# Patient Record
Sex: Female | Born: 1985 | Race: White | Hispanic: No | Marital: Single | Smoking: Never smoker
Health system: Southern US, Community
[De-identification: ages and names within clinical notes are randomized; demographics above are authoritative.]

## PROBLEM LIST (undated history)

## (undated) ENCOUNTER — Inpatient Hospital Stay (HOSPITAL_COMMUNITY): Payer: Self-pay

## (undated) DIAGNOSIS — R87629 Unspecified abnormal cytological findings in specimens from vagina: Secondary | ICD-10-CM

## (undated) DIAGNOSIS — T7840XA Allergy, unspecified, initial encounter: Secondary | ICD-10-CM

## (undated) DIAGNOSIS — Z8619 Personal history of other infectious and parasitic diseases: Secondary | ICD-10-CM

## (undated) HISTORY — PX: KNEE SURGERY: SHX244

## (undated) HISTORY — PX: EYE SURGERY: SHX253

## (undated) HISTORY — DX: Allergy, unspecified, initial encounter: T78.40XA

## (undated) HISTORY — DX: Personal history of other infectious and parasitic diseases: Z86.19

## (undated) HISTORY — PX: LEEP: SHX91

## (undated) HISTORY — PX: BELPHAROPTOSIS REPAIR: SHX369

## (undated) HISTORY — PX: TONSILLECTOMY: SUR1361

## (undated) HISTORY — DX: Unspecified abnormal cytological findings in specimens from vagina: R87.629

---
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
## 2006-01-14 ENCOUNTER — Emergency Department (HOSPITAL_COMMUNITY): Admission: EM | Admit: 2006-01-14 | Discharge: 2006-01-14 | Payer: Self-pay | Admitting: Emergency Medicine

## 2007-08-28 ENCOUNTER — Encounter: Admission: RE | Admit: 2007-08-28 | Discharge: 2007-08-28 | Payer: Self-pay | Admitting: Family Medicine

## 2008-04-02 ENCOUNTER — Emergency Department (HOSPITAL_COMMUNITY): Admission: EM | Admit: 2008-04-02 | Discharge: 2008-04-02 | Payer: Self-pay | Admitting: Emergency Medicine

## 2008-06-23 ENCOUNTER — Ambulatory Visit (HOSPITAL_COMMUNITY): Admission: RE | Admit: 2008-06-23 | Discharge: 2008-06-23 | Payer: Self-pay | Admitting: Obstetrics and Gynecology

## 2009-08-16 DIAGNOSIS — F419 Anxiety disorder, unspecified: Secondary | ICD-10-CM

## 2009-08-16 DIAGNOSIS — F32A Depression, unspecified: Secondary | ICD-10-CM | POA: Insufficient documentation

## 2009-08-16 HISTORY — DX: Anxiety disorder, unspecified: F41.9

## 2010-08-02 LAB — TYPE AND SCREEN
ABO/RH(D): A NEG
Antibody Screen: NEGATIVE

## 2010-08-02 LAB — CBC
MCHC: 34.2 g/dL (ref 30.0–36.0)
RDW: 12.4 % (ref 11.5–15.5)

## 2010-09-04 NOTE — Op Note (Signed)
Betty Gentry, NATHANIEL NO.:  [id number]   MEDICAL RECORD NO.:  [id number]          PATIENT TYPE:  AMB   LOCATION:  SDC                           FACILITY:  [facility]   PHYSICIAN:  Zelphia Cairo, MD    DATE OF BIRTH:  04/13/1986   DATE OF PROCEDURE:  DATE OF DISCHARGE:                               OPERATIVE REPORT   PREOPERATIVE DIAGNOSIS:  Chronic pelvic pain.   POSTOPERATIVE DIAGNOSES:  1. Chronic pelvic pain.  2. Left paratubal cyst.   PROCEDURE:  Diagnostic laparoscopy with I&D of left paratubal cyst.   SURGEON:  Zelphia Cairo, MD   ANESTHESIA:  General.   FINDINGS:  Left paratubal cyst.  Normal appendix.  Normal ovaries.  No  evidence of endometriosis.   COMPLICATIONS:  None.   ESTIMATED BLOOD LOSS:  Minimal.   ANESTHESIA:  General.   CONDITION:  Stable to recovery room.   PROCEDURE IN DETAIL:  Mahlia was taken to the operating room where  general anesthesia was obtained.  She was placed in the dorsal lithotomy  position using Allen stirrups.  She was prepped and draped in sterile  fashion, and a catheter was used to drain her bladder for 20 mL of clear  urine.  Speculum was placed in the vagina and a single-tooth tenaculum  placed on the anterior lip of the cervix.  Hulka clamp was placed on the  cervix to provide uterine manipulation.  Single-tooth tenaculum and  speculum were then removed.  Our attention was turned to the abdomen.   A 0.25% Marcaine was used to provide local anesthesia in the  infraumbilical region.  Infraumbilical incision was then made with a  scalpel and dissected bluntly to the level of the fascia using a Kelly  clamp.  The Veress needle was then inserted and 3 liters of CO2 were  used to insufflate the abdomen and pelvis.  Veress needle was then  removed and an optical trocar was inserted.  A survey of the abdomen and  pelvis was then performed, normal-appearing right upper quadrant and  appendix were visualized.   Bilateral ovaries appeared normal.  Right  fallopian tube appeared normal.  There was a small 5-7 mm paratubal cyst  noted on the left fallopian tube.  Uterus appeared normal.  Bilateral  ovarian fossa, uterosacral ligaments were found to be normal.  No  evidence of endometriosis was noted.  A suprapubic incision was then  made with a scalpel and a 5-mm trocar was inserted under direct  visualization.  Paratubal cyst was grasped with a blunt grasper and  scissors were used to incise the left paratubal cyst.  This drained for  clear fluid.  The pelvis was then irrigated and found to be hemostatic.  No other abnormalities were noted.  All  trocars and instruments were then removed from the abdomen.  The fascia  was reapproximated using Vicryl.  The skin was closed using 3-0 Vicryl  and Dermabond.  Hulka clamp was removed.  The patient was taken to the  recovery room in stable condition.      Zelphia Cairo, MD  Electronically Signed  GA/MEDQ  D:  06/23/2008  T:  06/23/2008  Job:  [id number]

## 2011-01-25 LAB — POCT PREGNANCY, URINE: Preg Test, Ur: NEGATIVE

## 2015-06-27 LAB — HM PAP SMEAR

## 2016-07-24 LAB — HM PAP SMEAR

## 2016-08-02 LAB — OB RESULTS CONSOLE ABO/RH: RH TYPE: NEGATIVE

## 2016-08-02 LAB — OB RESULTS CONSOLE ANTIBODY SCREEN: Antibody Screen: NEGATIVE

## 2016-08-02 LAB — OB RESULTS CONSOLE HIV ANTIBODY (ROUTINE TESTING): HIV: NONREACTIVE

## 2016-08-02 LAB — OB RESULTS CONSOLE GC/CHLAMYDIA
CHLAMYDIA, DNA PROBE: NEGATIVE
Gonorrhea: NEGATIVE

## 2016-08-02 LAB — OB RESULTS CONSOLE RPR: RPR: NONREACTIVE

## 2016-08-02 LAB — OB RESULTS CONSOLE RUBELLA ANTIBODY, IGM: RUBELLA: IMMUNE

## 2017-03-04 LAB — OB RESULTS CONSOLE GBS: GBS: NEGATIVE

## 2017-03-12 ENCOUNTER — Telehealth (HOSPITAL_COMMUNITY): Payer: Self-pay | Admitting: *Deleted

## 2017-03-12 ENCOUNTER — Encounter (HOSPITAL_COMMUNITY): Payer: Self-pay | Admitting: *Deleted

## 2017-03-12 NOTE — Telephone Encounter (Signed)
Preadmission screen  

## 2017-03-17 ENCOUNTER — Inpatient Hospital Stay (HOSPITAL_COMMUNITY)
Admission: AD | Admit: 2017-03-17 | Discharge: 2017-03-20 | DRG: 807 | Disposition: A | Discharge status: Home / Self Care | Payer: BLUE CROSS/BLUE SHIELD | Source: Ambulatory Visit | Attending: Obstetrics and Gynecology | Admitting: Obstetrics and Gynecology

## 2017-03-17 ENCOUNTER — Encounter (HOSPITAL_COMMUNITY): Payer: Self-pay | Admitting: Anesthesiology

## 2017-03-17 ENCOUNTER — Encounter (HOSPITAL_COMMUNITY): Payer: Self-pay

## 2017-03-17 DIAGNOSIS — Z3483 Encounter for supervision of other normal pregnancy, third trimester: Secondary | ICD-10-CM | POA: Diagnosis present

## 2017-03-17 DIAGNOSIS — Z3A40 40 weeks gestation of pregnancy: Secondary | ICD-10-CM

## 2017-03-17 DIAGNOSIS — O479 False labor, unspecified: Secondary | ICD-10-CM | POA: Diagnosis present

## 2017-03-17 HISTORY — DX: False labor, unspecified: O47.9

## 2017-03-17 LAB — CBC
HEMATOCRIT: 41.4 % (ref 36.0–46.0)
HEMOGLOBIN: 14 g/dL (ref 12.0–15.0)
MCH: 30.9 pg (ref 26.0–34.0)
MCHC: 33.8 g/dL (ref 30.0–36.0)
MCV: 91.4 fL (ref 78.0–100.0)
Platelets: 211 10*3/uL (ref 150–400)
RBC: 4.53 MIL/uL (ref 3.87–5.11)
RDW: 13.8 % (ref 11.5–15.5)
WBC: 13.8 10*3/uL — AB (ref 4.0–10.5)

## 2017-03-17 MED ORDER — EPHEDRINE 5 MG/ML INJ
10.0000 mg | INTRAVENOUS | Status: DC | PRN
  Filled 2017-03-17: qty 2

## 2017-03-17 MED ORDER — ONDANSETRON HCL 4 MG/2ML IJ SOLN
4.0000 mg | Freq: Four times a day (QID) | INTRAMUSCULAR | Status: DC | PRN
  Administered 2017-03-18: 4 mg via INTRAVENOUS
  Filled 2017-03-17: qty 2

## 2017-03-17 MED ORDER — DIPHENHYDRAMINE HCL 50 MG/ML IJ SOLN
12.5000 mg | INTRAMUSCULAR | Status: DC | PRN
  Filled 2017-03-17 (×2): qty 1

## 2017-03-17 MED ORDER — FENTANYL 2.5 MCG/ML BUPIVACAINE 1/10 % EPIDURAL INFUSION (WH - ANES)
14.0000 mL/h | INTRAMUSCULAR | Status: DC | PRN
  Administered 2017-03-18: 14 mL/h via EPIDURAL
  Filled 2017-03-17: qty 100

## 2017-03-17 MED ORDER — PHENYLEPHRINE 40 MCG/ML (10ML) SYRINGE FOR IV PUSH (FOR BLOOD PRESSURE SUPPORT)
80.0000 ug | PREFILLED_SYRINGE | INTRAVENOUS | Status: DC | PRN
  Filled 2017-03-17: qty 5
  Filled 2017-03-17: qty 10

## 2017-03-17 MED ORDER — ACETAMINOPHEN 325 MG PO TABS
650.0000 mg | ORAL_TABLET | ORAL | Status: DC | PRN
Start: 2017-03-17 — End: 2017-03-18

## 2017-03-17 MED ORDER — OXYCODONE-ACETAMINOPHEN 5-325 MG PO TABS
2.0000 | ORAL_TABLET | ORAL | Status: DC | PRN
Start: 2017-03-17 — End: 2017-03-18

## 2017-03-17 MED ORDER — OXYCODONE-ACETAMINOPHEN 5-325 MG PO TABS: 1.0000 | ORAL_TABLET | ORAL | Status: DC | PRN

## 2017-03-17 MED ORDER — LACTATED RINGERS IV SOLN
INTRAVENOUS | Status: DC
  Administered 2017-03-17: 21:00:00 via INTRAVENOUS

## 2017-03-17 MED ORDER — LACTATED RINGERS IV SOLN
500.0000 mL | Freq: Once | INTRAVENOUS | Status: AC
  Administered 2017-03-17: 500 mL via INTRAVENOUS

## 2017-03-17 MED ORDER — BUTORPHANOL TARTRATE 1 MG/ML IJ SOLN
1.0000 mg | INTRAMUSCULAR | Status: DC | PRN
  Administered 2017-03-17: 1 mg via INTRAVENOUS
  Filled 2017-03-17 (×2): qty 1

## 2017-03-17 MED ORDER — FLEET ENEMA 7-19 GM/118ML RE ENEM: 1.0000 | ENEMA | RECTAL | Status: DC | PRN

## 2017-03-17 MED ORDER — SOD CITRATE-CITRIC ACID 500-334 MG/5ML PO SOLN: 30.0000 mL | ORAL | Status: DC | PRN

## 2017-03-17 MED ORDER — OXYTOCIN 40 UNITS IN LACTATED RINGERS INFUSION - SIMPLE MED
2.5000 [IU]/h | INTRAVENOUS | Status: DC
Start: 2017-03-17 — End: 2017-03-18
  Filled 2017-03-17: qty 1000

## 2017-03-17 MED ORDER — PHENYLEPHRINE 40 MCG/ML (10ML) SYRINGE FOR IV PUSH (FOR BLOOD PRESSURE SUPPORT)
80.0000 ug | PREFILLED_SYRINGE | INTRAVENOUS | Status: DC | PRN
  Filled 2017-03-17: qty 5

## 2017-03-17 MED ORDER — LACTATED RINGERS IV SOLN: 500.0000 mL | INTRAVENOUS | Status: DC | PRN

## 2017-03-17 MED ORDER — OXYTOCIN BOLUS FROM INFUSION: 500.0000 mL | Freq: Once | INTRAVENOUS | Status: DC

## 2017-03-17 MED ORDER — LIDOCAINE HCL (PF) 1 % IJ SOLN
30.0000 mL | INTRAMUSCULAR | Status: DC | PRN
  Filled 2017-03-17: qty 30

## 2017-03-17 NOTE — H&P (Signed)
Betty Gentry is a 31 y.o. female presenting for UCs. No ROM. OB History    Gravida Para Term Preterm AB Living   1             SAB TAB Ectopic Multiple Live Births                 Past Medical History:  Diagnosis Date  . Vaginal Pap smear, abnormal    Past Surgical History:  Procedure Laterality Date  . BELPHAROPTOSIS REPAIR    . KNEE SURGERY    . LEEP     Family History: family history includes Heart attack in her paternal grandmother; Hypertension in her father; Lung cancer in her paternal grandmother. Social History:  reports that  has never smoked. she has never used smokeless tobacco. She reports that she does not drink alcohol or use drugs.     Maternal Diabetes: No Genetic Screening: Normal Maternal Ultrasounds/Referrals: Normal Fetal Ultrasounds or other Referrals:  None Maternal Substance Abuse:  No Significant Maternal Medications:  None Significant Maternal Lab Results:  None Other Comments:  None  Review of Systems  Eyes: Negative for blurred vision.  Gastrointestinal: Negative for abdominal pain.  Neurological: Negative for headaches.   Maternal Medical History:  Reason for admission: Contractions.   Contractions: Onset was 3-5 hours ago.    Fetal activity: Perceived fetal activity is normal.      Dilation: 4 Effacement (%): 90 Station: -1 Exam by:: dr Jasiah Buntin Blood pressure 102/69, pulse 64, temperature 98.6 F (37 C), temperature source Oral, resp. rate 18, height 5\' 2"  (1.575 m), weight 143 lb (64.9 kg), last menstrual period 06/05/2016, SpO2 100 %. Maternal Exam:  Uterine Assessment: Contraction strength is firm.  Contraction frequency is regular.   Abdomen: Patient reports no abdominal tenderness. Fetal presentation: vertex     Fetal Exam Fetal State Assessment: Category I - tracings are normal.     Physical Exam  Cardiovascular: Normal rate and regular rhythm.  Respiratory: Effort normal and breath sounds normal.  GI:  Soft. There is no tenderness.  Neurological: She has normal reflexes.    Cx 4/90/-1/vtx AROM clear  Prenatal labs: ABO, Rh: A/Negative/-- (04/13 0000) Antibody: Negative (04/13 0000) Rubella: Immune (04/13 0000) RPR: Nonreactive (04/13 0000)  HBsAg:    HIV: Non-reactive (04/13 0000)  GBS: Negative (11/13 0000)   Assessment/Plan: 31 yo G1P0 @ 40 5/7 weeks in active labor Anticipate vaginal delivery Epidural prn   Roselle LocusJames E Thressa Shiffer II 03/17/2017, 9:22 PM

## 2017-03-17 NOTE — Anesthesia Pain Management Evaluation Note (Signed)
  CRNA Pain Management Visit Note  Patient: Betty Gentry, 31 y.o., female  "Hello I am a member of the anesthesia team at [facility]. We have an anesthesia team available at all times to provide care throughout the hospital, including epidural management and anesthesia for C-section. I don't know your plan for the delivery whether it a natural birth, water birth, IV sedation, nitrous supplementation, doula or epidural, but we want to meet your pain goals."   1.Was your pain managed to your expectations on prior hospitalizations?   No prior hospitalizations  2.What is your expectation for pain management during this hospitalization?     Epidural  3.How can we help you reach that goal? unsure  Record the patient's initial score and the patient's pain goal.   Pain: 8  Pain Goal: 9 [facility] wants you to be able to say your pain was always managed very well.  Cephus ShellingBURGER,Tykeem Lanzer 03/17/2017

## 2017-03-17 NOTE — Progress Notes (Signed)
Ready for epidural FHT cat one UCs q2-3 min Cx 4/C/-2

## 2017-03-17 NOTE — MAU Note (Signed)
Patient states a lot of pain and period like cramps. Patient says no bleeding or fluid coming out.

## 2017-03-18 ENCOUNTER — Inpatient Hospital Stay (HOSPITAL_COMMUNITY): Payer: BLUE CROSS/BLUE SHIELD | Admitting: Anesthesiology

## 2017-03-18 ENCOUNTER — Encounter (HOSPITAL_COMMUNITY): Payer: Self-pay

## 2017-03-18 ENCOUNTER — Other Ambulatory Visit: Payer: Self-pay

## 2017-03-18 LAB — RPR: RPR Ser Ql: NONREACTIVE

## 2017-03-18 MED ORDER — SCOPOLAMINE 1 MG/3DAYS TD PT72
1.0000 | MEDICATED_PATCH | Freq: Once | TRANSDERMAL | Status: DC
  Filled 2017-03-18: qty 1

## 2017-03-18 MED ORDER — DIPHENHYDRAMINE HCL 50 MG/ML IJ SOLN: 12.5000 mg | INTRAMUSCULAR | Status: DC | PRN

## 2017-03-18 MED ORDER — COCONUT OIL OIL
1.0000 "application " | TOPICAL_OIL | Status: DC | PRN
  Administered 2017-03-18: 1 via TOPICAL
  Filled 2017-03-18: qty 120

## 2017-03-18 MED ORDER — NALOXONE HCL 0.4 MG/ML IJ SOLN: 0.4000 mg | INTRAMUSCULAR | Status: DC | PRN

## 2017-03-18 MED ORDER — PHENYLEPHRINE 40 MCG/ML (10ML) SYRINGE FOR IV PUSH (FOR BLOOD PRESSURE SUPPORT)
80.0000 ug | PREFILLED_SYRINGE | INTRAVENOUS | Status: DC | PRN
  Filled 2017-03-18: qty 5

## 2017-03-18 MED ORDER — OXYCODONE HCL 5 MG PO TABS: 10.0000 mg | ORAL_TABLET | ORAL | Status: DC | PRN

## 2017-03-18 MED ORDER — NALBUPHINE HCL 10 MG/ML IJ SOLN
5.0000 mg | INTRAMUSCULAR | Status: DC | PRN
Start: 2017-03-18 — End: 2017-03-20

## 2017-03-18 MED ORDER — ONDANSETRON HCL 4 MG/2ML IJ SOLN: 4.0000 mg | Freq: Three times a day (TID) | INTRAMUSCULAR | Status: DC | PRN

## 2017-03-18 MED ORDER — KETOROLAC TROMETHAMINE 30 MG/ML IJ SOLN: 30.0000 mg | Freq: Four times a day (QID) | INTRAMUSCULAR | Status: AC | PRN

## 2017-03-18 MED ORDER — NALBUPHINE HCL 10 MG/ML IJ SOLN: 5.0000 mg | Freq: Once | INTRAMUSCULAR | Status: DC | PRN

## 2017-03-18 MED ORDER — PRENATAL MULTIVITAMIN CH
1.0000 | ORAL_TABLET | Freq: Every day | ORAL | Status: DC
  Administered 2017-03-18 – 2017-03-19 (×2): 1 via ORAL
  Filled 2017-03-18 (×2): qty 1

## 2017-03-18 MED ORDER — EPHEDRINE 5 MG/ML INJ
10.0000 mg | INTRAVENOUS | Status: DC | PRN
  Filled 2017-03-18: qty 2

## 2017-03-18 MED ORDER — SODIUM CHLORIDE 0.9% FLUSH: 3.0000 mL | INTRAVENOUS | Status: DC | PRN

## 2017-03-18 MED ORDER — LIDOCAINE HCL (PF) 1 % IJ SOLN
INTRAMUSCULAR | Status: DC | PRN
  Administered 2017-03-18 (×2): 5 mL via EPIDURAL

## 2017-03-18 MED ORDER — ONDANSETRON HCL 4 MG/2ML IJ SOLN: 4.0000 mg | INTRAMUSCULAR | Status: DC | PRN

## 2017-03-18 MED ORDER — OXYCODONE HCL 5 MG PO TABS: 5.0000 mg | ORAL_TABLET | ORAL | Status: DC | PRN

## 2017-03-18 MED ORDER — DIBUCAINE 1 % RE OINT: 1.0000 "application " | TOPICAL_OINTMENT | RECTAL | Status: DC | PRN

## 2017-03-18 MED ORDER — SIMETHICONE 80 MG PO CHEW
80.0000 mg | CHEWABLE_TABLET | ORAL | Status: DC | PRN
Start: 2017-03-18 — End: 2017-03-20

## 2017-03-18 MED ORDER — DIPHENHYDRAMINE HCL 50 MG/ML IJ SOLN
12.5000 mg | INTRAMUSCULAR | Status: DC | PRN
  Administered 2017-03-18 (×2): 12.5 mg via INTRAVENOUS

## 2017-03-18 MED ORDER — ZOLPIDEM TARTRATE 5 MG PO TABS: 5.0000 mg | ORAL_TABLET | Freq: Every evening | ORAL | Status: DC | PRN

## 2017-03-18 MED ORDER — ONDANSETRON HCL 4 MG PO TABS
4.0000 mg | ORAL_TABLET | ORAL | Status: DC | PRN
Start: 2017-03-18 — End: 2017-03-20

## 2017-03-18 MED ORDER — DIPHENHYDRAMINE HCL 25 MG PO CAPS: 25.0000 mg | ORAL_CAPSULE | Freq: Four times a day (QID) | ORAL | Status: DC | PRN

## 2017-03-18 MED ORDER — OXYTOCIN 40 UNITS IN LACTATED RINGERS INFUSION - SIMPLE MED
1.0000 m[IU]/min | INTRAVENOUS | Status: DC
  Administered 2017-03-18: 2 m[IU]/min via INTRAVENOUS

## 2017-03-18 MED ORDER — NALBUPHINE HCL 10 MG/ML IJ SOLN: 5.0000 mg | INTRAMUSCULAR | Status: DC | PRN

## 2017-03-18 MED ORDER — LACTATED RINGERS IV SOLN: 500.0000 mL | Freq: Once | INTRAVENOUS | Status: AC

## 2017-03-18 MED ORDER — MEPERIDINE HCL 25 MG/ML IJ SOLN: 6.2500 mg | INTRAMUSCULAR | Status: DC | PRN

## 2017-03-18 MED ORDER — TETANUS-DIPHTH-ACELL PERTUSSIS 5-2.5-18.5 LF-MCG/0.5 IM SUSP: 0.5000 mL | Freq: Once | INTRAMUSCULAR | Status: DC

## 2017-03-18 MED ORDER — WITCH HAZEL-GLYCERIN EX PADS
1.0000 "application " | MEDICATED_PAD | CUTANEOUS | Status: DC | PRN
  Administered 2017-03-19: 1 via TOPICAL

## 2017-03-18 MED ORDER — DIPHENHYDRAMINE HCL 25 MG PO CAPS: 25.0000 mg | ORAL_CAPSULE | ORAL | Status: DC | PRN

## 2017-03-18 MED ORDER — BENZOCAINE-MENTHOL 20-0.5 % EX AERO
1.0000 "application " | INHALATION_SPRAY | CUTANEOUS | Status: DC | PRN
  Administered 2017-03-18: 1 via TOPICAL
  Filled 2017-03-18: qty 56

## 2017-03-18 MED ORDER — SENNOSIDES-DOCUSATE SODIUM 8.6-50 MG PO TABS
2.0000 | ORAL_TABLET | ORAL | Status: DC
  Administered 2017-03-18 – 2017-03-19 (×2): 2 via ORAL
  Filled 2017-03-18 (×2): qty 2

## 2017-03-18 MED ORDER — ACETAMINOPHEN 325 MG PO TABS
650.0000 mg | ORAL_TABLET | ORAL | Status: DC | PRN
  Administered 2017-03-18 – 2017-03-20 (×6): 650 mg via ORAL
  Filled 2017-03-18 (×6): qty 2

## 2017-03-18 MED ORDER — IBUPROFEN 600 MG PO TABS
600.0000 mg | ORAL_TABLET | Freq: Four times a day (QID) | ORAL | Status: DC
  Administered 2017-03-18 – 2017-03-20 (×9): 600 mg via ORAL
  Filled 2017-03-18 (×9): qty 1

## 2017-03-18 MED ORDER — NALOXONE HCL 0.4 MG/ML IJ SOLN
1.0000 ug/kg/h | INTRAVENOUS | Status: DC | PRN
  Filled 2017-03-18: qty 5

## 2017-03-18 NOTE — Anesthesia Procedure Notes (Addendum)
Epidural Patient location during procedure: OB Start time: 03/18/2017 12:00 AM End time: 03/18/2017 12:10 AM  Staffing Anesthesiologist: Bethena Midgetddono, Kyleigha Markert, MD  Preanesthetic Checklist Completed: patient identified, site marked, surgical consent, pre-op evaluation, timeout performed, IV checked, risks and benefits discussed and monitors and equipment checked  Epidural Patient position: sitting Prep: site prepped and draped and DuraPrep Patient monitoring: continuous pulse ox and blood pressure Approach: midline Location: L4-L5 Injection technique: LOR air  Needle:  Needle type: Tuohy  Needle gauge: 17 G Needle length: 9 cm and 9 Needle insertion depth: 6 cm Catheter type: closed end flexible Catheter size: 19 Gauge Catheter at skin depth: 11 cm Test dose: negative  Assessment Events: blood not aspirated, injection not painful, no injection resistance, negative IV test and no paresthesia

## 2017-03-18 NOTE — Anesthesia Postprocedure Evaluation (Signed)
Anesthesia Post Note  Patient: Betty Gentry  Procedure(s) Performed: AN AD HOC LABOR EPIDURAL     Patient location during evaluation: Mother Baby Anesthesia Type: Epidural Level of consciousness: awake and alert and oriented Pain management: satisfactory to patient Vital Signs Assessment: post-procedure vital signs reviewed and stable Respiratory status: respiratory function stable Cardiovascular status: stable Postop Assessment: no headache, no backache, epidural receding, patient able to bend at knees, no signs of nausea or vomiting and adequate PO intake Anesthetic complications: no    Last Vitals:  Vitals:   03/18/17 0942 03/18/17 1431  BP: (!) 96/58 (!) 94/49  Pulse: (!) 54 (!) 45  Resp: 18 18  Temp: 36.8 C 36.9 C  SpO2:      Last Pain:  Vitals:   03/18/17 1528  TempSrc:   PainSc: 0-No pain   Pain Goal:                 Tomoki Lucken

## 2017-03-18 NOTE — Anesthesia Preprocedure Evaluation (Signed)

## 2017-03-18 NOTE — Lactation Note (Signed)
This note was copied from a baby's chart. Lactation Consultation Note  Patient Name: Betty Gentry ZOXWR'UToday's Date: 03/18/2017 Reason for consult: Initial assessment   P1, Baby 12 hours old.  Reviewed hand expression and drops expressed. Baby is latching well per mom on L side but is having more difficulty on R side. Mother has short shaft nipples. Had mother prepump w/ manual pump and hand express. Assisted w/ football position. Using compression was able to latch baby and sustain latch on R side. Sucks and swallows observed. Reviewed basics.  Mom encouraged to feed baby 8-12 times/24 hours and with feeding cues.  Mom made aware of O/P services, breastfeeding support groups, community resources, and our phone # for post-discharge questions.     Maternal Data Has patient been taught Hand Expression?: Yes Does the patient have breastfeeding experience prior to this delivery?: No  Feeding Feeding Type: Breast Fed Length of feed: 10 min  LATCH Score Latch: Repeated attempts needed to sustain latch, nipple held in mouth throughout feeding, stimulation needed to elicit sucking reflex.  Audible Swallowing: Spontaneous and intermittent  Type of Nipple: Everted at rest and after stimulation  Comfort (Breast/Nipple): Filling, red/small blisters or bruises, mild/mod discomfort  Hold (Positioning): Assistance needed to correctly position infant at breast and maintain latch.  LATCH Score: 7  Interventions Interventions: Breast feeding basics reviewed;Assisted with latch;Skin to skin;Breast massage;Hand express;Pre-pump if needed;Support pillows;Position options;Expressed milk  Lactation Tools Discussed/Used     Consult Status Consult Status: Follow-up Date: 03/19/17 Follow-up type: In-patient    Dahlia ByesBerkelhammer, Lindsy Cerullo [facility] 03/18/2017, 6:46 PM

## 2017-03-18 NOTE — Progress Notes (Signed)
Cx 5/C/0 to +1 FHT cat one LOT

## 2017-03-18 NOTE — Progress Notes (Signed)
Operative Delivery Note At 6:33 AM a viable female was delivered via Vaginal, Spontaneous.  Presentation: vertex; Position: Right,, Occiput,, Anterior; Station: +3.  Verbal consent: obtained from patient.  Risks and benefits discussed in detail.  Risks include, but are not limited to the risks of anesthesia, bleeding, infection, damage to maternal tissues, fetal cephalhematoma.  There is also the risk of inability to effect vaginal delivery of the head, or shoulder dystocia that cannot be resolved by established maneuvers, leading to the need for emergency cesarean section. Steady progress to delivery of head. Mild shoulder dystocia-McRoberts, suprapubic pressure, MLE, normal traction APGAR: 7, 9; weight  .   Placenta status:intact , .   Cord: 3 vessels with the following complications: .  Cord pH: pending  Anesthesia:  epidural Instruments: mushroom VE, 1 popoff Episiotomy: Median Lacerations: 2nd degree Suture Repair: 2.0 vicryl rapide Est. Blood Loss (mL):  300  Mom to postpartum.  Baby to Couplet care / Skin to Skin.  Roselle LocusJames E Jameka Ivie II 03/18/2017, 6:48 AM

## 2017-03-19 ENCOUNTER — Inpatient Hospital Stay (HOSPITAL_COMMUNITY): Admission: RE | Admit: 2017-03-19 | Payer: BLUE CROSS/BLUE SHIELD | Source: Ambulatory Visit

## 2017-03-19 LAB — CBC
HCT: 37.6 % (ref 36.0–46.0)
HEMOGLOBIN: 12.6 g/dL (ref 12.0–15.0)
MCH: 31 pg (ref 26.0–34.0)
MCHC: 33.5 g/dL (ref 30.0–36.0)
MCV: 92.6 fL (ref 78.0–100.0)
Platelets: 157 10*3/uL (ref 150–400)
RBC: 4.06 MIL/uL (ref 3.87–5.11)
RDW: 14.2 % (ref 11.5–15.5)
WBC: 16.7 10*3/uL — ABNORMAL HIGH (ref 4.0–10.5)

## 2017-03-19 LAB — BIRTH TISSUE RECOVERY COLLECTION (PLACENTA DONATION)

## 2017-03-19 MED ORDER — RHO D IMMUNE GLOBULIN 1500 UNIT/2ML IJ SOSY
300.0000 ug | PREFILLED_SYRINGE | Freq: Once | INTRAMUSCULAR | Status: AC
  Administered 2017-03-19: 300 ug via INTRAVENOUS
  Filled 2017-03-19: qty 2

## 2017-03-19 NOTE — Lactation Note (Addendum)
This note was copied from a baby's chart. Lactation Consultation Note  Patient Name: Betty Gentry WGNFA'OToday's Date: 03/19/2017 Reason for consult: Follow-up assessment;Infant weight loss(5% weight loss )  Baby is 7033 hours old.  As LC entered the room baby latched in modified cross cradle, and noted to be stuffy.  Tried 1st to adjust hips, and ease baby back to moms right side. Stuffiness continued,  And baby released easily. LC suspects baby had a shallow latch.  LC switched the baby's position to a football on the left breast with firm pillow support, Baby opened wide and LC assisted to latch, depth achieved, swallows noted, increased  With breast compressions. Baby fed 10 mins, and released, nipple well rounded.  Baby content.  LC reviewed breast feeding basics and the importance of tickling upper lip waiting for a wide  Open mouth and  Compressing breast tissue until swallows and allow the breast tissue to mold  In the baby's mouth.  Per mom the feeding was comfortable. Also mom seemed impressed the baby stayed latch  And the stuffiness decreased. After the baby released sneezed a few times and cleared it even more.   Dad plans to bring moms DEBP Medela in for am consult so LC can show mom and dad how to  Use it.      Maternal Data Has patient been taught Hand Expression?: Yes  Feeding Feeding Type: Breast Fed(switched from cross cradle to football on the left breast . ) Length of feed: 10 min(multiple swallows, increased with breast compressions )  LATCH Score Latch: Grasps breast easily, tongue down, lips flanged, rhythmical sucking.(baby's stuffiness improved )  Audible Swallowing: Spontaneous and intermittent  Type of Nipple: Everted at rest and after stimulation  Comfort (Breast/Nipple): Soft / non-tender  Hold (Positioning): Assistance needed to correctly position infant at breast and maintain latch.  LATCH Score: 9  Interventions Interventions: Breast  feeding basics reviewed;Assisted with latch;Skin to skin;Breast massage;Hand express;Breast compression;Adjust position;Support pillows;Position options  Lactation Tools Discussed/Used Tools: Pump Breast pump type: Manual Pump Review: (has a hand pump set up by [facility] )   Consult Status Consult Status: Follow-up Date: 03/20/17 Follow-up type: In-patient    Betty SprangMargaret Ann Elihue Gentry 03/19/2017, 3:36 PM

## 2017-03-19 NOTE — Progress Notes (Signed)
Post Partum Day 1 Subjective: no complaints and up ad lib  Objective: Blood pressure (!) 99/56, pulse 61, temperature 97.7 F (36.5 C), temperature source Axillary, resp. rate 17, height 5\' 2"  (1.575 m), weight 143 lb (64.9 kg), last menstrual period 06/05/2016, SpO2 100 %, unknown if currently breastfeeding.  Physical Exam:  General: alert and cooperative Lochia: appropriate Uterine Fundus: firm Incision: n/a DVT Evaluation: No evidence of DVT seen on physical exam.  Recent Labs    03/17/17 2036 03/19/17 0503  HGB 14.0 12.6  HCT 41.4 37.6    Assessment/Plan: Plan for discharge tomorrow   LOS: 2 days   Zelphia CairoGretchen Parveen Freehling 03/19/2017, 9:21 AM

## 2017-03-19 NOTE — Progress Notes (Signed)
Mother told about needing the MMR and risk factors. She is not sure if she wants the MMR shot before DC.

## 2017-03-20 LAB — RH IG WORKUP (INCLUDES ABO/RH)
ABO/RH(D): A NEG
Fetal Screen: NEGATIVE
GESTATIONAL AGE(WKS): 40.6
Unit division: 0

## 2017-03-20 NOTE — Lactation Note (Signed)
This note was copied from a baby's chart. Lactation Consultation Note  Patient Name: Betty Gentry ZOXWR'UToday's Date: 03/20/2017 Reason for consult: Follow-up assessment;Infant weight loss;1st time breastfeeding;Nipple pain/trauma(7% weight loss, per mom nipples are to sore to latch , see LC note )  Baby is 8151 hours old .  LC reviewed doc flow sheets / WNL for D/C  Per mom to sore to latch and the baby takes a bottle so much better.  LC explored feeding options and sore nipple tx with mom.  Today - to give the nipples a break and allow healing.  LC assessed breast tissue with moms permission and noted some areola bruising,  And nipples pinky red, no breakdown.  LC reviewed sore nipple and engorgement prevention and tx. Per mom has  DEBP ( Medela ) at home.  LC recommended applying a dab of coconut oil to nipples/ and areolas prior to pumping.  Discussed supply and demand and the importance of consistent pumping at least 8 x's a day to establish  And protect milk supply.  In the mean time to feed the baby - use a medium based nipple - PACE feeding and gradually increase volume Gradually. When nipples improved with soreness, if mom desires to re-latch, may have to give the baby a 5-10 ml appetizer,  Pace feeding, and then latch. LC mentioned to mom it can be a different story once the milk comes in. LC reviewed  engorgement prevention and tx.  LC mentioned to mom [facility] has a LC in the office and if she would like a feeding assessment, connect with the  [facility] office.  Mother informed of post-discharge support and given phone number to the lactation department, including services for phone call assistance; out-patient appointments; and breastfeeding support group. List of other breastfeeding resources in the community given in the handout. Encouraged mother to call for problems or concerns related to breastfeeding.   Maternal Data    Feeding Feeding Type: Formula  LATCH  Score                   Interventions Interventions: Breast feeding basics reviewed  Lactation Tools Discussed/Used WIC Program: No   Consult Status Consult Status: Complete Date: 03/20/17    Kathrin GreathouseMargaret Ann Lathaniel Legate 03/20/2017, 9:36 AM

## 2017-03-20 NOTE — Discharge Summary (Signed)
Obstetric Discharge Summary Reason for Admission: onset of labor Prenatal Procedures: none Intrapartum Procedures: vacuum Postpartum Procedures: none Complications-Operative and Postpartum: none Hemoglobin  Date Value Ref Range Status  03/19/2017 12.6 12.0 - 15.0 g/dL Final   HCT  Date Value Ref Range Status  03/19/2017 37.6 36.0 - 46.0 % Final    Physical Exam:  General: alert Lochia: appropriate Uterine Fundus: firm Incision: healing well DVT Evaluation: No evidence of DVT seen on physical exam.  Discharge Diagnoses: Term Pregnancy-delivered  Discharge Information: Date: 03/20/2017 Activity: pelvic rest Diet: routine Medications: PNV and Ibuprofen Condition: stable Instructions: refer to practice specific booklet Discharge to: home Follow-up Information    [facility] Schedule an appointment as soon as possible for a visit in 6 week(s).   Contact information: [address] [place] [id number] [phone]           Newborn Data: Live born female  Birth Weight: 8 lb 5.2 oz (3776 g) APGAR: 7, 9  Newborn Delivery   Time head delivered:  03/18/2017 06:32:00 Birth date/time:  03/18/2017 06:33:00 Delivery type:  Vaginal, Vacuum (Extractor)     Home with mother.  Chivas Notz Milana ObeyM Layden Caterino 03/20/2017, 8:31 AM

## 2017-03-20 NOTE — Lactation Note (Addendum)
This note was copied from a baby's chart. Lactation Consultation Note Baby 45 hrs old. Had been cluster feeding w/short frequent feeds. Moms nipples red, very tender. Discussed NS d/t sore nipples and easing the pain during BF. Mom stated that is fine, she will try that today, but tonight she needs to give her nipples a break and wants to give formula.  LEAD discussed. Formula feeding information sheet given w/amounts to give according to hours of age, as well as formula preparation sheet. Mom concerned baby isn't getting enough. Noted output decreased some. Encouraged to assess before and after BF for transition of milk. Encouraged breast massage occasionally during BF. Taught pace feeding. FOB and mom had a lot of questions. Encouraged mom to call for latch assistance if needed, also teaching NS application and latching. Comfort gels given. Reported to RN.  Patient Name: Girl Kara Pacershley Banfill ZOXWR'UToday's Date: 03/20/2017 Reason for consult: Mother's request;Nipple pain/trauma   Maternal Data    Feeding Feeding Type: Formula Nipple Type: Slow - flow  LATCH Score       Type of Nipple: Flat  Comfort (Breast/Nipple): Filling, red/small blisters or bruises, mild/mod discomfort        Interventions Interventions: Comfort gels;Breast massage;Hand express;Position options;Support pillows  Lactation Tools Discussed/Used Tools: Comfort gels   Consult Status Consult Status: Follow-up Date: 03/20/17 Follow-up type: In-patient    Soumya Colson, Diamond NickelLAURA G 03/20/2017, 4:21 AM

## 2017-03-21 LAB — TYPE AND SCREEN
ABO/RH(D): A NEG
ANTIBODY SCREEN: POSITIVE
Unit division: 0
Unit division: 0

## 2017-03-21 LAB — BPAM RBC
Blood Product Expiration Date: 201812242359
Blood Product Expiration Date: 201812242359
UNIT TYPE AND RH: 600
Unit Type and Rh: 600

## 2017-04-02 ENCOUNTER — Emergency Department (HOSPITAL_BASED_OUTPATIENT_CLINIC_OR_DEPARTMENT_OTHER)
Admission: EM | Admit: 2017-04-02 | Discharge: 2017-04-02 | Disposition: A | Discharge status: Home / Self Care | Payer: BLUE CROSS/BLUE SHIELD | Attending: Emergency Medicine | Admitting: Emergency Medicine

## 2017-04-02 ENCOUNTER — Emergency Department (HOSPITAL_BASED_OUTPATIENT_CLINIC_OR_DEPARTMENT_OTHER): Payer: BLUE CROSS/BLUE SHIELD

## 2017-04-02 ENCOUNTER — Other Ambulatory Visit: Payer: Self-pay

## 2017-04-02 ENCOUNTER — Encounter (HOSPITAL_BASED_OUTPATIENT_CLINIC_OR_DEPARTMENT_OTHER): Payer: Self-pay | Admitting: *Deleted

## 2017-04-02 DIAGNOSIS — K2970 Gastritis, unspecified, without bleeding: Secondary | ICD-10-CM | POA: Diagnosis not present

## 2017-04-02 DIAGNOSIS — R1011 Right upper quadrant pain: Secondary | ICD-10-CM | POA: Diagnosis present

## 2017-04-02 DIAGNOSIS — K2900 Acute gastritis without bleeding: Secondary | ICD-10-CM

## 2017-04-02 LAB — CBC WITH DIFFERENTIAL/PLATELET
BASOS ABS: 0 10*3/uL (ref 0.0–0.1)
Basophils Relative: 0 %
EOS PCT: 2 %
Eosinophils Absolute: 0.2 10*3/uL (ref 0.0–0.7)
HEMATOCRIT: 44.5 % (ref 36.0–46.0)
Hemoglobin: 15.3 g/dL — ABNORMAL HIGH (ref 12.0–15.0)
LYMPHS ABS: 3.1 10*3/uL (ref 0.7–4.0)
LYMPHS PCT: 26 %
MCH: 30.8 pg (ref 26.0–34.0)
MCHC: 34.4 g/dL (ref 30.0–36.0)
MCV: 89.7 fL (ref 78.0–100.0)
MONOS PCT: 6 %
Monocytes Absolute: 0.7 10*3/uL (ref 0.1–1.0)
NEUTROS ABS: 7.7 10*3/uL (ref 1.7–7.7)
NEUTROS PCT: 66 %
PLATELETS: 345 10*3/uL (ref 150–400)
RBC: 4.96 MIL/uL (ref 3.87–5.11)
RDW: 12.8 % (ref 11.5–15.5)
WBC: 11.8 10*3/uL — ABNORMAL HIGH (ref 4.0–10.5)

## 2017-04-02 LAB — COMPREHENSIVE METABOLIC PANEL
ALBUMIN: 3.6 g/dL (ref 3.5–5.0)
ALT: 20 U/L (ref 14–54)
ANION GAP: 8 (ref 5–15)
AST: 21 U/L (ref 15–41)
Alkaline Phosphatase: 120 U/L (ref 38–126)
BUN: 8 mg/dL (ref 6–20)
CALCIUM: 8.8 mg/dL — AB (ref 8.9–10.3)
CHLORIDE: 102 mmol/L (ref 101–111)
CO2: 28 mmol/L (ref 22–32)
Creatinine, Ser: 0.73 mg/dL (ref 0.44–1.00)
GFR calc non Af Amer: >60 mL/min (ref >60)
GLUCOSE: 115 mg/dL — AB (ref 65–99)
POTASSIUM: 3.5 mmol/L (ref 3.5–5.1)
SODIUM: 138 mmol/L (ref 135–145)
Total Bilirubin: 0.5 mg/dL (ref 0.3–1.2)
Total Protein: 6.5 g/dL (ref 6.5–8.1)

## 2017-04-02 LAB — URINALYSIS, MICROSCOPIC (REFLEX)

## 2017-04-02 LAB — LIPASE, BLOOD: LIPASE: 33 U/L (ref 11–51)

## 2017-04-02 LAB — URINALYSIS, ROUTINE W REFLEX MICROSCOPIC
BILIRUBIN URINE: NEGATIVE
Glucose, UA: NEGATIVE mg/dL
KETONES UR: NEGATIVE mg/dL
NITRITE: NEGATIVE
PROTEIN: NEGATIVE mg/dL
Specific Gravity, Urine: <1.005 — ABNORMAL LOW (ref 1.005–1.030)
pH: 7 (ref 5.0–8.0)

## 2017-04-02 MED ORDER — SODIUM CHLORIDE 0.9 % IV BOLUS (SEPSIS)
1000.0000 mL | Freq: Once | INTRAVENOUS | Status: AC
  Administered 2017-04-02: 1000 mL via INTRAVENOUS

## 2017-04-02 MED ORDER — ONDANSETRON HCL 4 MG/2ML IJ SOLN
4.0000 mg | Freq: Once | INTRAMUSCULAR | Status: AC
  Administered 2017-04-02: 4 mg via INTRAVENOUS
  Filled 2017-04-02: qty 2

## 2017-04-02 MED ORDER — MORPHINE SULFATE (PF) 4 MG/ML IV SOLN
4.0000 mg | Freq: Once | INTRAVENOUS | Status: AC
  Administered 2017-04-02: 4 mg via INTRAVENOUS
  Filled 2017-04-02: qty 1

## 2017-04-02 MED ORDER — DICYCLOMINE HCL 20 MG PO TABS: 20.0000 mg | ORAL_TABLET | Freq: Two times a day (BID) | ORAL | 0 refills | Status: DC

## 2017-04-02 MED ORDER — PANTOPRAZOLE SODIUM 20 MG PO TBEC: 20.0000 mg | DELAYED_RELEASE_TABLET | Freq: Every day | ORAL | 0 refills | Status: DC

## 2017-04-02 MED ORDER — GI COCKTAIL ~~LOC~~
30.0000 mL | Freq: Once | ORAL | Status: AC
  Administered 2017-04-02: 30 mL via ORAL
  Filled 2017-04-02: qty 30

## 2017-04-02 NOTE — ED Notes (Signed)
Pt verbalizes understanding of d/c instructions and denies any further needs at this time. 

## 2017-04-02 NOTE — ED Triage Notes (Addendum)
Pt c/o abd pain and nausea ,diarrhea,  back pain, x 6 days , increased after eating, 11/28 vaginal delivery. Called OB today was told to take lomotil , pt did not.

## 2017-04-02 NOTE — ED Provider Notes (Signed)
[facility] EMERGENCY DEPARTMENT Provider Note   CSN: [id number] Arrival date & time: 04/02/17  2033     History   Chief Complaint Chief Complaint  Patient presents with  . Abdominal Pain    HPI Betty Gentry is a 31 y.o. female.  HPI  31 y.o. female, presents to the Emergency Department today due to abdominal pain and nausea with associated diarrhea. Notes symptoms x 6 days. Location in RUQ. Worse with eating. Intermittent pain and currently rates 6/10. Describes as pressure sensation. No CP/SOB. No dysuria. No fevers. No meds PTA. Last PO around 1500. Of note, pt with Vaginal delivery without complications on 03-19-17. Called OB initially and told to take Lomotil. No other symptoms noted    Past Medical History:  Diagnosis Date  . Vaginal Pap smear, abnormal     Patient Active Problem List   Diagnosis Date Noted  . Uterine contractions during pregnancy 03/17/2017    Past Surgical History:  Procedure Laterality Date  . BELPHAROPTOSIS REPAIR    . KNEE SURGERY    . LEEP      OB History    Gravida Para Term Preterm AB Living   1 1 1     1    SAB TAB Ectopic Multiple Live Births         0 1       Home Medications    Prior to Admission medications   Medication Sig Start Date End Date Taking? Authorizing Provider  acetaminophen (TYLENOL) 325 MG tablet Take 650 mg by mouth every 6 (six) hours as needed.   Yes [provider]    Family History Family History  Problem Relation Age of Onset  . Hypertension Father   . Heart attack Paternal Grandmother   . Lung cancer Paternal Grandmother     Social History Social History   Tobacco Use  . Smoking status: Never Smoker  . Smokeless tobacco: Never Used  Substance Use Topics  . Alcohol use: No    Frequency: Never  . Drug use: No     Allergies   Erythromycin; Sulfa antibiotics; and Tetracyclines & related   Review of Systems Review of Systems ROS reviewed and all are negative  for acute change except as noted in the HPI.  Physical Exam Updated Vital Signs BP 128/86   Pulse 61   Temp 98.1 F (36.7 C) (Oral)   Resp 16   Ht 5\' 2"  (1.575 m)   Wt 55.8 kg (123 lb)   SpO2 100%   BMI 22.50 kg/m   Physical Exam  Constitutional: She is oriented to person, place, and time. She appears well-developed and well-nourished. No distress.  HENT:  Head: Normocephalic and atraumatic.  Right Ear: Tympanic membrane, external ear and ear canal normal.  Left Ear: Tympanic membrane, external ear and ear canal normal.  Nose: Nose normal.  Mouth/Throat: Uvula is midline, oropharynx is clear and moist and mucous membranes are normal. No trismus in the jaw. No oropharyngeal exudate, posterior oropharyngeal erythema or tonsillar abscesses.  Eyes: EOM are normal. Pupils are equal, round, and reactive to light.  Neck: Normal range of motion. Neck supple. No tracheal deviation present.  Cardiovascular: Normal rate, regular rhythm, S1 normal, S2 normal, normal heart sounds, intact distal pulses and normal pulses.  Pulmonary/Chest: Effort normal and breath sounds normal. No respiratory distress. She has no decreased breath sounds. She has no wheezes. She has no rhonchi. She has no rales.  Abdominal: Normal appearance and bowel  sounds are normal. There is tenderness in the right upper quadrant, epigastric area and left upper quadrant. There is positive Murphy's sign. There is no rigidity, no rebound, no guarding, no CVA tenderness and no tenderness at McBurney's point.  Abdomen soft  Musculoskeletal: Normal range of motion.  Neurological: She is alert and oriented to person, place, and time.  Skin: Skin is warm and dry.  Psychiatric: She has a normal mood and affect. Her speech is normal and behavior is normal. Thought content normal.  Nursing note and vitals reviewed.    ED Treatments / Results  Labs (all labs ordered are listed, but only abnormal results are displayed) Labs Reviewed   URINALYSIS, ROUTINE W REFLEX MICROSCOPIC - Abnormal; Notable for the following components:      Result Value   Specific Gravity, Urine <1.005 (*)    Hgb urine dipstick SMALL (*)    Leukocytes, UA SMALL (*)    All other components within normal limits  COMPREHENSIVE METABOLIC PANEL - Abnormal; Notable for the following components:   Glucose, Bld 115 (*)    Calcium 8.8 (*)    All other components within normal limits  CBC WITH DIFFERENTIAL/PLATELET - Abnormal; Notable for the following components:   WBC 11.8 (*)    Hemoglobin 15.3 (*)    All other components within normal limits  URINALYSIS, MICROSCOPIC (REFLEX) - Abnormal; Notable for the following components:   Bacteria, UA FEW (*)    Squamous Epithelial / LPF 0-5 (*)    All other components within normal limits  URINE CULTURE  LIPASE, BLOOD    EKG  EKG Interpretation None       Radiology [place] Abdomen Limited Ruq  Result Date: 04/02/2017 CLINICAL DATA:  Right upper quadrant pain, nausea and vomiting for 6 days EXAM: ULTRASOUND ABDOMEN LIMITED RIGHT UPPER QUADRANT COMPARISON:  None. FINDINGS: Gallbladder: No gallstones or wall thickening visualized. No sonographic Murphy sign noted by sonographer. Common bile duct: Diameter: 2.6 mm Liver: No focal lesion identified. Within normal limits in parenchymal echogenicity. Portal vein is patent on color Doppler imaging with normal direction of blood flow towards the liver. IMPRESSION: Normal liver, gallbladder and bile ducts. Electronically Signed   By: Ellery Plunkaniel R Mitchell M.D.   On: 04/02/2017 22:49    Procedures Procedures (including critical care time)  Medications Ordered in ED Medications  sodium chloride 0.9 % bolus 1,000 mL (0 mLs Intravenous Stopped 04/02/17 2259)  morphine 4 MG/ML injection 4 mg (4 mg Intravenous Given 04/02/17 2206)  ondansetron (ZOFRAN) injection 4 mg (4 mg Intravenous Given 04/02/17 2205)  gi cocktail (Maalox,Lidocaine,Donnatal) (30 mLs Oral Given  04/02/17 2316)     Initial Impression / Assessment and Plan / ED Course  I have reviewed the triage vital signs and the nursing notes.  Pertinent labs & imaging results that were available during my care of the patient were reviewed by me and considered in my medical decision making (see chart for details).  Final Clinical Impressions(s) / ED Diagnoses  {I have reviewed and evaluated the relevant laboratory values. {I have reviewed and evaluated the relevant imaging studies.  {I have reviewed the relevant previous healthcare records.  {I obtained HPI from historian.   ED Course:  Assessment: Pt is a 31 y.o. female presents to the Emergency Department today due to abdominal pain and nausea with associated diarrhea. Notes symptoms x 6 days. Location in RUQ. Worse with eating. Intermittent pain and currently rates 6/10. Describes as pressure sensation. No CP/SOB. No dysuria.  No fevers. No meds PTA. Last PO around 1500. Of note, pt with Vaginal delivery without complications on 03-19-17. Called OB initially and told to take Lomotil. On exam, pt in NAD. Nontoxic/nonseptic appearing. VSS. Afebrile. Lungs CTA. Heart RRR. Abdomen TTP RUQ/LUQ. Labs unremarkable. RUQ US unremarkable. Given analgesia and fluids in ED. Given GI cocktail with relief. Plan is to DC home with follow up to PCP. At time of discharge, Patient is in no acute distress. Vital Signs are stable. Patient is able to ambulate. Patient able to tolerate PO.   Disposition/Plan:  DC Home Additional Verbal discharge instructions given and discussed with patient.  Pt Instructed to f/u with PCP in the next week for evaluation and treatment of symptoms. Return precautions given Pt acknowledges and agrees with plan  Supervising Physician Arby BarrettePfeiffer, Marcy, MD  Final diagnoses:  RUQ pain  Acute gastritis, presence of bleeding unspecified, unspecified gastritis type    ED Discharge Orders    None       Audry PiliMohr, Laconda Basich, PA-C 04/02/17  2333    Arby BarrettePfeiffer, Marcy, MD 04/04/17 [phone]

## 2017-04-02 NOTE — ED Notes (Signed)
Patient transported to Ultrasound 

## 2017-04-02 NOTE — Discharge Instructions (Signed)
Please read and follow all provided instructions.  Your diagnoses today include:  1. Acute gastritis, presence of bleeding unspecified, unspecified gastritis type   2. RUQ pain     Tests performed today include: Blood counts and electrolytes Blood tests to check liver and kidney function Blood tests to check pancreas function Urine test to look for infection and pregnancy (in women) Vital signs. See below for your results today.   Medications prescribed:   Take any prescribed medications only as directed.  Home care instructions:  Follow any educational materials contained in this packet.  Follow-up instructions: Please follow-up with your primary care provider in the next 2 days for further evaluation of your symptoms.    Return instructions:  SEEK IMMEDIATE MEDICAL ATTENTION IF: The pain does not go away or becomes severe  A temperature above 101F develops  Repeated vomiting occurs (multiple episodes)  The pain becomes localized to portions of the abdomen. The right side could possibly be appendicitis. In an adult, the left lower portion of the abdomen could be colitis or diverticulitis.  Blood is being passed in stools or vomit (bright red or black tarry stools)  You develop chest pain, difficulty breathing, dizziness or fainting, or become confused, poorly responsive, or inconsolable (young children) If you have any other emergent concerns regarding your health  Additional Information: Abdominal (belly) pain can be caused by many things. Your caregiver performed an examination and possibly ordered blood/urine tests and imaging (CT scan, x-rays, ultrasound). Many cases can be observed and treated at home after initial evaluation in the emergency department. Even though you are being discharged home, abdominal pain can be unpredictable. Therefore, you need a repeated exam if your pain does not resolve, returns, or worsens. Most patients with abdominal pain don't have to be  admitted to the hospital or have surgery, but serious problems like appendicitis and gallbladder attacks can start out as nonspecific pain. Many abdominal conditions cannot be diagnosed in one visit, so follow-up evaluations are very important.  Your vital signs today were: BP 112/65 (BP Location: Left Arm)    Pulse (!) 53    Temp 98.1 F (36.7 C) (Oral)    Resp 16    Ht 5\' 2"  (1.575 m)    Wt 55.8 kg (123 lb)    SpO2 100%    BMI 22.50 kg/m  If your blood pressure (bp) was elevated above 135/85 this visit, please have this repeated by your doctor within one month. --------------

## 2017-04-04 LAB — URINE CULTURE

## 2017-10-07 ENCOUNTER — Encounter: Payer: Self-pay | Admitting: Physician Assistant

## 2017-10-07 ENCOUNTER — Ambulatory Visit: Payer: BLUE CROSS/BLUE SHIELD | Admitting: Physician Assistant

## 2017-10-07 VITALS — BP 110/72 | HR 81 | Temp 98.3°F | Ht 63.0 in | Wt 124.0 lb

## 2017-10-07 DIAGNOSIS — M25569 Pain in unspecified knee: Secondary | ICD-10-CM

## 2017-10-07 DIAGNOSIS — J069 Acute upper respiratory infection, unspecified: Secondary | ICD-10-CM

## 2017-10-07 DIAGNOSIS — Z7689 Persons encountering health services in other specified circumstances: Secondary | ICD-10-CM

## 2017-10-07 HISTORY — DX: Pain in unspecified knee: M25.569

## 2017-10-07 MED ORDER — HYDROCOD POLST-CPM POLST ER 10-8 MG/5ML PO SUER: 5.0000 mL | Freq: Two times a day (BID) | ORAL | 0 refills | Status: DC | PRN

## 2017-10-07 MED ORDER — AZITHROMYCIN 250 MG PO TABS: ORAL_TABLET | ORAL | 0 refills | Status: DC

## 2017-10-07 NOTE — Patient Instructions (Signed)
It was great to see you!  Use medication as prescribed: Tussionex and Z-pack Push fluids and get plenty of rest. Please return if you are not improving as expected, or if you have high fevers (>101.5) or difficulty swallowing or worsening productive cough.  Call clinic with questions.  I hope you start feeling better soon!

## 2017-10-07 NOTE — Progress Notes (Signed)
Betty Gentry is a 32 y.o. female here to Establish Care.  I acted as a Neurosurgeon for [employer], PA-C Corky Mull, LPN  History of Present Illness:   Chief Complaint  Patient presents with  . Establish Care  . Sinus Problem    Acute Concerns: Sinus infection -- 38 month old daughter recently got over ear infection. Daughter is in daycare. Patient denies fever, but does have junky cough with green sputum.  No history of asthma. Currently not breastfeeding. Has tried Delsym without relief. 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 Denies SOB.  Health Maintenance: Immunizations -- UTD Colonoscopy -- N/A Mammogram -- N/A PAP -- done 2018 normal per pt, will obtain results from GYN Dr. Charleen Kirks Density -- N/A Weight -- Weight: 124 lb (56.2 kg)   Depression screen [facility] 2/9 10/07/2017  Decreased Interest 0  Down, Depressed, Hopeless 0  PHQ - 2 Score 0    No flowsheet data found.   Other providers/specialists: Dr. Henderson Cloud -- ob-gyn    Past Medical History:  Diagnosis Date  . History of chicken pox   . Vaginal delivery 03/18/2017  . Vaginal Pap smear, abnormal      Social History   Socioeconomic History  . Marital status: Single    Spouse name: Not on file  . Number of children: Not on file  . Years of education: Not on file  . Highest education level: Not on file  Occupational History  . Not on file  Social Needs  . Financial resource strain: Not on file  . Food insecurity:    Worry: Not on file    Inability: Not on file  . Transportation needs:    Medical: Not on file    Non-medical: Not on file  Tobacco Use  . Smoking status: Never Smoker  . Smokeless tobacco: Never Used  Substance and Sexual Activity  . Alcohol use: No    Frequency: Never  . Drug use: No  . Sexual activity: Yes    Birth control/protection: Pill  Lifestyle  . Physical activity:    Days per week: Not on file    Minutes per session: Not on file  . Stress: Not on  file  Relationships  . Social connections:    Talks on phone: Not on file    Gets together: Not on file    Attends religious service: Not on file    Active member of club or organization: Not on file    Attends meetings of clubs or organizations: Not on file    Relationship status: Not on file  . Intimate partner violence:    Fear of current or ex partner: Not on file    Emotionally abused: Not on file    Physically abused: Not on file    Forced sexual activity: Not on file  Other Topics Concern  . Not on file  Social History Narrative   New mama    Past Surgical History:  Procedure Laterality Date  . BELPHAROPTOSIS REPAIR    . KNEE SURGERY    . LEEP      Family History  Problem Relation Age of Onset  . Hypertension Father   . High Cholesterol Father   . Heart attack Paternal Grandmother   . Lung cancer Paternal Grandmother   . Asthma Mother   . Arthritis Maternal Grandmother   . Heart disease Maternal Grandmother   . Heart attack Maternal Grandmother     Allergies  Allergen Reactions  . Erythromycin Nausea Only  . Sulfa Antibiotics Nausea Only  . Tetracyclines & Related Nausea Only     Current Medications:   Current Outpatient Medications:  .  acetaminophen (TYLENOL) 325 MG tablet, Take 650 mg by mouth every 6 (six) hours as needed., Disp: , Rfl:  .  meloxicam (MOBIC) 15 MG tablet, Mobic 15 mg tablet  Take 1 tablet every day by oral route as needed., Disp: , Rfl:  .  norgestimate-ethinyl estradiol (SPRINTEC 28) 0.25-35 MG-MCG tablet, Sprintec (28) 0.25 mg-35 mcg tablet  TAKE 1 TABLET BY MOUTH EVERY DAY, Disp: , Rfl:  .  triamcinolone cream (KENALOG) 0.1 %, triamcinolone acetonide 0.1 % topical cream  APPLY TO AFFECTED AREA TWICE A DAY UNTIL RASH CLEARS, Disp: , Rfl:  .  azithromycin (ZITHROMAX) 250 MG tablet, Take 2 tablets on day 1, then one tablet daily x 4 days, Disp: 6 tablet, Rfl: 0 .  chlorpheniramine-HYDROcodone (TUSSIONEX PENNKINETIC ER) 10-8 MG/5ML  SUER, Take 5 mLs by mouth every 12 (twelve) hours as needed for cough., Disp: 115 mL, Rfl: 0   Review of Systems:   Review of Systems  Constitutional: Negative.  Negative for chills, fever, malaise/fatigue and weight loss.  HENT: Positive for congestion (Nasal, green drainage) and sore throat. Negative for hearing loss and sinus pain.   Eyes: Negative.  Negative for blurred vision.  Respiratory: Positive for cough. Negative for shortness of breath.   Cardiovascular: Negative.  Negative for chest pain, palpitations and leg swelling.  Gastrointestinal: Positive for heartburn. Negative for abdominal pain, constipation, diarrhea, nausea and vomiting.  Genitourinary: Negative.  Negative for dysuria, frequency and urgency.  Musculoskeletal: Negative.  Negative for back pain, myalgias and neck pain.  Skin: Negative.  Negative for itching and rash.  Neurological: Positive for headaches. Negative for dizziness, tingling, seizures and loss of consciousness.  Endo/Heme/Allergies: Negative for polydipsia.  Psychiatric/Behavioral: Negative.  Negative for depression. The patient is not nervous/anxious.     Vitals:   Vitals:   10/07/17 0839  BP: 110/72  Pulse: 81  Temp: 98.3 F (36.8 C)  TempSrc: Oral  SpO2: 99%  Weight: 124 lb (56.2 kg)  Height: 5\' 3"  (1.6 m)     Body mass index is 21.97 kg/m.  Physical Exam:   Physical Exam  Constitutional: She appears well-developed. She is cooperative.  Non-toxic appearance. She does not have a sickly appearance. She does not appear ill. No distress.  HENT:  Head: Normocephalic and atraumatic.  Right Ear: Tympanic membrane, external ear and ear canal normal. Tympanic membrane is not erythematous, not retracted and not bulging.  Left Ear: Tympanic membrane, external ear and ear canal normal. Tympanic membrane is not erythematous, not retracted and not bulging.  Nose: Mucosal edema and rhinorrhea present. Right sinus exhibits no maxillary sinus  tenderness and no frontal sinus tenderness. Left sinus exhibits no maxillary sinus tenderness and no frontal sinus tenderness.  Mouth/Throat: Uvula is midline and mucous membranes are normal. Posterior oropharyngeal erythema present. No posterior oropharyngeal edema. Tonsils are 0 on the right. Tonsils are 0 on the left. No tonsillar exudate.  Eyes: Conjunctivae and lids are normal.  Neck: Trachea normal.  Cardiovascular: Normal rate, regular rhythm, S1 normal, S2 normal and normal heart sounds.  Pulmonary/Chest: Effort normal and breath sounds normal. She has no decreased breath sounds. She has no wheezes. She has no rhonchi. She has no rales.  Lymphadenopathy:    She has cervical adenopathy.  Neurological: She is alert.  Skin: Skin is warm, dry and intact.  Psychiatric: She has a normal mood and affect. Her speech is normal and behavior is normal.  Nursing note and vitals reviewed.   Assessment and Plan:    Morrie Sheldonshley was seen today for establish care and sinus problem.  Diagnoses and all orders for this visit:  Encounter to establish care  Upper respiratory tract infection, unspecified type  Other orders -     azithromycin (ZITHROMAX) 250 MG tablet; Take 2 tablets on day 1, then one tablet daily x 4 days -     chlorpheniramine-HYDROcodone (TUSSIONEX PENNKINETIC ER) 10-8 MG/5ML SUER; Take 5 mLs by mouth every 12 (twelve) hours as needed for cough.   No red flags on exam.  Will initiate Tussionex and Azithromycin per orders. She does endorse allergy to erythromycin but states that she has had z-pack in the past and tolerated well. Discussed taking medications as prescribed. Reviewed return precautions including worsening fever, SOB, worsening cough or other concerns. Push fluids and rest. I recommend that patient follow-up if symptoms worsen or persist despite treatment x 7-10 days, sooner if needed.   . Reviewed expectations re: course of current medical issues. . Discussed  self-management of symptoms. . Outlined signs and symptoms indicating need for more acute intervention. . Patient verbalized understanding and all questions were answered. . See orders for this visit as documented in the electronic medical record. . Patient received an After-Visit Summary.   Jarold MottoSamantha Cressie Betzler, PA-C

## 2017-10-17 ENCOUNTER — Ambulatory Visit (INDEPENDENT_AMBULATORY_CARE_PROVIDER_SITE_OTHER): Payer: BLUE CROSS/BLUE SHIELD | Admitting: Physician Assistant

## 2017-10-17 ENCOUNTER — Telehealth: Payer: Self-pay | Admitting: Physician Assistant

## 2017-10-17 ENCOUNTER — Encounter: Payer: Self-pay | Admitting: Physician Assistant

## 2017-10-17 ENCOUNTER — Other Ambulatory Visit: Payer: Self-pay | Admitting: Physician Assistant

## 2017-10-17 VITALS — BP 100/68 | HR 77 | Temp 98.4°F | Ht 63.0 in | Wt 125.5 lb

## 2017-10-17 DIAGNOSIS — R7989 Other specified abnormal findings of blood chemistry: Secondary | ICD-10-CM

## 2017-10-17 DIAGNOSIS — Z0001 Encounter for general adult medical examination with abnormal findings: Secondary | ICD-10-CM

## 2017-10-17 DIAGNOSIS — Z136 Encounter for screening for cardiovascular disorders: Secondary | ICD-10-CM

## 2017-10-17 DIAGNOSIS — Z13220 Encounter for screening for lipoid disorders: Secondary | ICD-10-CM

## 2017-10-17 DIAGNOSIS — K219 Gastro-esophageal reflux disease without esophagitis: Secondary | ICD-10-CM | POA: Diagnosis not present

## 2017-10-17 DIAGNOSIS — F419 Anxiety disorder, unspecified: Secondary | ICD-10-CM | POA: Diagnosis not present

## 2017-10-17 DIAGNOSIS — Z79899 Other long term (current) drug therapy: Secondary | ICD-10-CM

## 2017-10-17 HISTORY — DX: Gastro-esophageal reflux disease without esophagitis: K21.9

## 2017-10-17 LAB — COMPREHENSIVE METABOLIC PANEL
ALK PHOS: 69 U/L (ref 39–117)
ALT: 11 U/L (ref 0–35)
AST: 17 U/L (ref 0–37)
Albumin: 4.2 g/dL (ref 3.5–5.2)
BUN: 10 mg/dL (ref 6–23)
CO2: 26 mEq/L (ref 19–32)
CREATININE: 0.78 mg/dL (ref 0.40–1.20)
Calcium: 9.2 mg/dL (ref 8.4–10.5)
Chloride: 105 mEq/L (ref 96–112)
GFR: 91.73 mL/min (ref >60.00)
GLUCOSE: 90 mg/dL (ref 70–99)
POTASSIUM: 4.3 meq/L (ref 3.5–5.1)
SODIUM: 139 meq/L (ref 135–145)
Total Bilirubin: 0.5 mg/dL (ref 0.2–1.2)
Total Protein: 6.6 g/dL (ref 6.0–8.3)

## 2017-10-17 LAB — CBC WITH DIFFERENTIAL/PLATELET
BASOS ABS: 0.1 10*3/uL (ref 0.0–0.1)
Basophils Relative: 0.8 % (ref 0.0–3.0)
EOS ABS: 0.2 10*3/uL (ref 0.0–0.7)
EOS PCT: 3.3 % (ref 0.0–5.0)
HCT: 42.1 % (ref 36.0–46.0)
HEMOGLOBIN: 14.4 g/dL (ref 12.0–15.0)
LYMPHS ABS: 2.7 10*3/uL (ref 0.7–4.0)
Lymphocytes Relative: 44.1 % (ref 12.0–46.0)
MCHC: 34.3 g/dL (ref 30.0–36.0)
MCV: 86.6 fl (ref 78.0–100.0)
MONO ABS: 0.4 10*3/uL (ref 0.1–1.0)
Monocytes Relative: 7 % (ref 3.0–12.0)
NEUTROS PCT: 44.8 % (ref 43.0–77.0)
Neutro Abs: 2.8 10*3/uL (ref 1.4–7.7)
Platelets: 262 10*3/uL (ref 150.0–400.0)
RBC: 4.86 Mil/uL (ref 3.87–5.11)
RDW: 12.3 % (ref 11.5–15.5)
WBC: 6.2 10*3/uL (ref 4.0–10.5)

## 2017-10-17 LAB — LIPID PANEL
Cholesterol: 188 mg/dL (ref 0–200)
HDL: 44.9 mg/dL (ref >39.00)
LDL CALC: 110 mg/dL — AB (ref 0–99)
NONHDL: 143.5
Total CHOL/HDL Ratio: 4
Triglycerides: 169 mg/dL — ABNORMAL HIGH (ref 0.0–149.0)
VLDL: 33.8 mg/dL (ref 0.0–40.0)

## 2017-10-17 LAB — VITAMIN B12: Vitamin B-12: 391 pg/mL (ref 211–911)

## 2017-10-17 LAB — TSH: TSH: 1.96 u[IU]/mL (ref 0.35–4.50)

## 2017-10-17 LAB — T4, FREE: FREE T4: 0.8 ng/dL (ref 0.60–1.60)

## 2017-10-17 MED ORDER — BUSPIRONE HCL 7.5 MG PO TABS: 7.5000 mg | ORAL_TABLET | Freq: Three times a day (TID) | ORAL | 2 refills | Status: DC

## 2017-10-17 MED ORDER — PROPRANOLOL HCL 10 MG PO TABS: 10.0000 mg | ORAL_TABLET | Freq: Three times a day (TID) | ORAL | 2 refills | Status: DC | PRN

## 2017-10-17 NOTE — Telephone Encounter (Signed)
Copied from CRM [phone]#[id number]. Topic: Quick Communication - Rx Refill/Question >> Oct 17, 2017  8:59 AM Betty Gentry, Betty Gentry wrote: Medication:  propranolol (INDERAL) 10 MG tablet (not wanted) Pt saw samantha this am.  Pt states she would prefer to try the Buspar first, and not the  propranolol (INDERAL) 10 MG tablet . Would like you to call the buspar into  [facility] [address] [place], [place] - [address] [phone] (Phone) [phone] (Fax)

## 2017-10-17 NOTE — Progress Notes (Signed)
Please call patient and let them know that all of the labwork that we completed came back normal, including kidney, liver, blood sugar, infection count, vitamin B12 and thyroid levels. Cholesterol was very slightly elevated but not enough to warrant starting medications.

## 2017-10-17 NOTE — Progress Notes (Signed)
I acted as a Neurosurgeon for [employer], PA-C Corky Mull, LPN  Subjective:    Betty Gentry is a 32 y.o. female and is here for a comprehensive physical exam.  HPI  There are no preventive care reminders to display for this patient.  Acute Concerns: Anxiety -- having difficulty tolerating things that she normally does well with, such as handling customer complaints. Getting good sleep. Was on Celexa in the past -- was for anxiety, doesn't remember if she tolerated this well because she wasn't on it for very long. Doesn't feel like she needs a daily medication.   Chronic Issues: GERD -- currently on omeprazole daily, symptoms are very well controlled while on this medication. 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  Health Maintenance: Immunizations -- UTD Colonoscopy -- N/A Mammogram -- N/A PAP --done 07/24/2016 NILM Bone Density -- N/A Diet -- wide variety of foods Caffeine intake -- drinks "a lot" of cherry coke Sleep habits -- sleeps well Exercise -- no time for scheduled exercise Current Weight -- Weight: 125 lb 8 oz (56.9 kg)  Weight History: Wt Readings from Last 10 Encounters:  10/17/17 125 lb 8 oz (56.9 kg)  10/07/17 124 lb (56.2 kg)  04/02/17 123 lb (55.8 kg)  03/17/17 143 lb (64.9 kg)  Mood -- overall good, irritable and stressed at times Patient's last menstrual period was 10/06/2017.  Depression screen PHQ 2/9 10/17/2017  Decreased Interest 0  Down, Depressed, Hopeless 0  PHQ - 2 Score 0   Other providers/specialists: [place] -- Ob-Gyn   PMHx, SurgHx, SocialHx, Medications, and Allergies were reviewed in the Visit Navigator and updated as appropriate.   Past Medical History:  Diagnosis Date  . History of chicken pox   . Vaginal delivery 03/18/2017  . Vaginal Pap smear, abnormal      Past Surgical History:  Procedure Laterality Date    . BELPHAROPTOSIS REPAIR    . KNEE SURGERY    . LEEP       Family History  Problem Relation Age of Onset  . Hypertension Father   . High Cholesterol Father   . Heart attack Paternal Grandmother   . Lung cancer Paternal Grandmother   . Asthma Mother   . Arthritis Maternal Grandmother   . Heart disease Maternal Grandmother   . Heart attack Maternal Grandmother     Social History   Tobacco Use  . Smoking status: Never Smoker  . Smokeless tobacco: Never Used  Substance Use Topics  . Alcohol use: No    Frequency: Never  . Drug use: No    Review of Systems:   Review of Systems  Constitutional: Negative.  Negative for chills, fever, malaise/fatigue and weight loss.  HENT: Negative.  Negative for hearing loss, sinus pain and sore throat.   Eyes: Negative.  Negative for blurred vision.  Respiratory: Negative.  Negative for cough and shortness of breath.   Cardiovascular: Negative.  Negative for chest pain, palpitations and leg swelling.  Gastrointestinal: Positive for heartburn. Negative for abdominal pain, constipation, diarrhea, nausea and vomiting.  Genitourinary: Negative.  Negative for dysuria, frequency and urgency.  Musculoskeletal: Negative.  Negative for back pain, myalgias and neck pain.  Skin: Negative.  Negative for itching and rash.  Neurological: Negative.  Negative for dizziness, tingling, seizures, loss of consciousness and headaches.  Endo/Heme/Allergies: Negative.  Negative for polydipsia.  Psychiatric/Behavioral: Negative.  Negative for depression. The patient is not nervous/anxious.     Objective:   BP 100/68 (BP Location: Left Arm, Patient Position: Sitting, Cuff Size: Normal)   Pulse 77   Temp 98.4 F (36.9 C) (Oral)   Ht 5\' 3"  (1.6 m)   Wt 125 lb 8 oz (56.9 kg)   LMP 10/06/2017   SpO2 99%   Breastfeeding? No   BMI 22.23 kg/m   General Appearance:    Alert, cooperative, no distress, appears stated age  Head:    Normocephalic, without obvious  abnormality, atraumatic  Eyes:    PERRL, conjunctiva/corneas clear, EOM's intact, fundi    benign, both eyes  Ears:    Normal TM's and external ear canals, both ears  Nose:   Nares normal, septum midline, mucosa normal, no drainage    or sinus tenderness  Throat:   Lips, mucosa, and tongue normal; teeth and gums normal  Neck:   Supple, symmetrical, trachea midline, no adenopathy;    thyroid:  no enlargement/tenderness/nodules; no carotid   bruit or JVD  Back:     Symmetric, no curvature, ROM normal, no CVA tenderness  Lungs:     Clear to auscultation bilaterally, respirations unlabored  Chest Wall:    No tenderness or deformity   Heart:    Regular rate and rhythm, S1 and S2 normal, no murmur, rub   or gallop  Breast Exam:    No tenderness, masses, or nipple abnormality  Abdomen:     Soft, non-tender, bowel sounds active all four quadrants,    no masses, no organomegaly  Genitalia:    Normal female without lesion, discharge or tenderness  Rectal:    Normal tone no masses or tenderness  Extremities:   Extremities normal, atraumatic, no cyanosis or edema  Pulses:   2+ and symmetric all extremities  Skin:   Skin color, texture, turgor normal, no rashes or lesions  Lymph nodes:   Cervical, supraclavicular, and axillary nodes normal  Neurologic:   CNII-XII intact, normal strength, sensation and reflexes    throughout    Assessment/Plan:   Betty Gentry was seen today for annual exam.  Diagnoses and all orders for this visit:  Encounter for general adult medical examination with abnormal findings Today patient counseled on age appropriate routine health concerns for screening and prevention, each reviewed and up to date or declined. Immunizations reviewed and up to date or declined. Labs ordered and reviewed. Risk factors for depression reviewed and negative. Hearing function and visual acuity are intact. ADLs screened and addressed as needed. Functional ability and level of safety reviewed and  appropriate. Education, counseling and referrals performed based on assessed risks today. Patient provided with a copy of personalized plan for preventive services.  Declined STI testing.  Anxiety We discussed options. Considered prn buspar vs propronalol. After discussion, will try 10mg  propranolol prn. Follow-up in 3 months, sooner if needed. -     CBC with Differential/Platelet -     Comprehensive metabolic panel  Encounter for lipid screening for cardiovascular disease -     Lipid panel  Abnormal TSH Will re-check TSH and free T4 today. -     T4, free -     TSH  Long term use of drug Continue omeprazole, check Vit B12. -     Vitamin B12  Gastroesophageal reflux disease, esophagitis presence not specified Continue omeprazole. -     Vitamin B12  Other orders -     propranolol (INDERAL) 10 MG tablet; Take  1 tablet (10 mg total) by mouth 3 (three) times daily as needed.   Well Adult Exam: Labs ordered: Yes. Patient counseling was done. See below for items discussed. Discussed the patient's BMI. The BMI BMI is in the acceptable range Follow up in 3 months.  Patient Counseling:   [x]     Nutrition: Stressed importance of moderation in sodium/caffeine intake, saturated fat and cholesterol, caloric balance, sufficient intake of fresh fruits, vegetables, fiber, calcium, iron, and 1 mg of folate supplement per day (for females capable of pregnancy).   [x]      Stressed the importance of regular exercise.    [x]     Substance Abuse: Discussed cessation/primary prevention of tobacco, alcohol, or other drug use; driving or other dangerous activities under the influence; availability of treatment for abuse.    [x]      Injury prevention: Discussed safety belts, safety helmets, smoke detector, smoking near bedding or upholstery.    [x]      Sexuality: Discussed sexually transmitted diseases, partner selection, use of condoms, avoidance of unintended pregnancy  and contraceptive  alternatives.    [x]     Dental health: Discussed importance of regular tooth brushing, flossing, and dental visits.   [x]      Health maintenance and immunizations reviewed. Please refer to Health maintenance section.   CMA or LPN served as scribe during this visit. History, Physical, and Plan performed by medical provider. Documentation and orders reviewed and attested to.   Jarold MottoSamantha Saron Tweed, PA-C [place] [facility]

## 2017-10-17 NOTE — Telephone Encounter (Signed)
I have sent in the Buspar.  Jarold MottoSamantha Jaquala Fuller PA-C

## 2017-10-17 NOTE — Telephone Encounter (Signed)
Please see message. °

## 2017-10-17 NOTE — Telephone Encounter (Signed)
[facility]. Pt verbalized understanding.

## 2017-10-17 NOTE — Telephone Encounter (Signed)
See note

## 2017-10-17 NOTE — Patient Instructions (Signed)
Follow-up in 3 months for anxiety, sooner if needed.  We will contact you with your lab results.  Health Maintenance, Female Adopting a healthy lifestyle and getting preventive care can go a long way to promote health and wellness. Talk with your health care provider about what schedule of regular examinations is right for you. This is a good chance for you to check in with your provider about disease prevention and staying healthy. In between checkups, there are plenty of things you can do on your own. Experts have done a lot of research about which lifestyle changes and preventive measures are most likely to keep you healthy. Ask your health care provider for more information. Weight and diet Eat a healthy diet  Be sure to include plenty of vegetables, fruits, low-fat dairy products, and lean protein.  Do not eat a lot of foods high in solid fats, added sugars, or salt.  Get regular exercise. This is one of the most important things you can do for your health. ? Most adults should exercise for at least 150 minutes each week. The exercise should increase your heart rate and make you sweat (moderate-intensity exercise). ? Most adults should also do strengthening exercises at least twice a week. This is in addition to the moderate-intensity exercise.  Maintain a healthy weight  Body mass index (BMI) is a measurement that can be used to identify possible weight problems. It estimates body fat based on height and weight. Your health care provider can help determine your BMI and help you achieve or maintain a healthy weight.  For females 56 years of age and older: ? A BMI below 18.5 is considered underweight. ? A BMI of 18.5 to 24.9 is normal. ? A BMI of 25 to 29.9 is considered overweight. ? A BMI of 30 and above is considered obese.  Watch levels of cholesterol and blood lipids  You should start having your blood tested for lipids and cholesterol at 31 years of age, then have this test  every 5 years.  You may need to have your cholesterol levels checked more often if: ? Your lipid or cholesterol levels are high. ? You are older than 31 years of age. ? You are at high risk for heart disease.  Cancer screening Lung Cancer  Lung cancer screening is recommended for adults 34-38 years old who are at high risk for lung cancer because of a history of smoking.  A yearly low-dose CT scan of the lungs is recommended for people who: ? Currently smoke. ? Have quit within the past 15 years. ? Have at least a 30-pack-year history of smoking. A pack year is smoking an average of one pack of cigarettes a day for 1 year.  Yearly screening should continue until it has been 15 years since you quit.  Yearly screening should stop if you develop a health problem that would prevent you from having lung cancer treatment.  Breast Cancer  Practice breast self-awareness. This means understanding how your breasts normally appear and feel.  It also means doing regular breast self-exams. Let your health care provider know about any changes, no matter how small.  If you are in your 20s or 30s, you should have a clinical breast exam (CBE) by a health care provider every 1-3 years as part of a regular health exam.  If you are 65 or older, have a CBE every year. Also consider having a breast X-ray (mammogram) every year.  If you have a family history  of breast cancer, talk to your health care provider about genetic screening.  If you are at high risk for breast cancer, talk to your health care provider about having an MRI and a mammogram every year.  Breast cancer gene (BRCA) assessment is recommended for women who have family members with BRCA-related cancers. BRCA-related cancers include: ? Breast. ? Ovarian. ? Tubal. ? Peritoneal cancers.  Results of the assessment will determine the need for genetic counseling and BRCA1 and BRCA2 testing.  Cervical Cancer Your health care provider  may recommend that you be screened regularly for cancer of the pelvic organs (ovaries, uterus, and vagina). This screening involves a pelvic examination, including checking for microscopic changes to the surface of your cervix (Pap test). You may be encouraged to have this screening done every 3 years, beginning at age 30.  For women ages 64-65, health care providers may recommend pelvic exams and Pap testing every 3 years, or they may recommend the Pap and pelvic exam, combined with testing for human papilloma virus (HPV), every 5 years. Some types of HPV increase your risk of cervical cancer. Testing for HPV may also be done on women of any age with unclear Pap test results.  Other health care providers may not recommend any screening for nonpregnant women who are considered low risk for pelvic cancer and who do not have symptoms. Ask your health care provider if a screening pelvic exam is right for you.  If you have had past treatment for cervical cancer or a condition that could lead to cancer, you need Pap tests and screening for cancer for at least 20 years after your treatment. If Pap tests have been discontinued, your risk factors (such as having a new sexual partner) need to be reassessed to determine if screening should resume. Some women have medical problems that increase the chance of getting cervical cancer. In these cases, your health care provider may recommend more frequent screening and Pap tests.  Colorectal Cancer  This type of cancer can be detected and often prevented.  Routine colorectal cancer screening usually begins at 31 years of age and continues through 31 years of age.  Your health care provider may recommend screening at an earlier age if you have risk factors for colon cancer.  Your health care provider may also recommend using home test kits to check for hidden blood in the stool.  A small camera at the end of a tube can be used to examine your colon directly  (sigmoidoscopy or colonoscopy). This is done to check for the earliest forms of colorectal cancer.  Routine screening usually begins at age 12.  Direct examination of the colon should be repeated every 5-10 years through 31 years of age. However, you may need to be screened more often if early forms of precancerous polyps or small growths are found.  Skin Cancer  Check your skin from head to toe regularly.  Tell your health care provider about any new moles or changes in moles, especially if there is a change in a mole's shape or color.  Also tell your health care provider if you have a mole that is larger than the size of a pencil eraser.  Always use sunscreen. Apply sunscreen liberally and repeatedly throughout the day.  Protect yourself by wearing long sleeves, pants, a wide-brimmed hat, and sunglasses whenever you are outside.  Heart disease, diabetes, and high blood pressure  High blood pressure causes heart disease and increases the risk of stroke. High  blood pressure is more likely to develop in: ? People who have blood pressure in the high end of the normal range (130-139/85-89 mm Hg). ? People who are overweight or obese. ? People who are African American.  If you are 18-39 years of age, have your blood pressure checked every 3-5 years. If you are 40 years of age or older, have your blood pressure checked every year. You should have your blood pressure measured twice-once when you are at a hospital or clinic, and once when you are not at a hospital or clinic. Record the average of the two measurements. To check your blood pressure when you are not at a hospital or clinic, you can use: ? An automated blood pressure machine at a pharmacy. ? A home blood pressure monitor.  If you are between 55 years and 79 years old, ask your health care provider if you should take aspirin to prevent strokes.  Have regular diabetes screenings. This involves taking a blood sample to check your  fasting blood sugar level. ? If you are at a normal weight and have a low risk for diabetes, have this test once every three years after 31 years of age. ? If you are overweight and have a high risk for diabetes, consider being tested at a younger age or more often. Preventing infection Hepatitis B  If you have a higher risk for hepatitis B, you should be screened for this virus. You are considered at high risk for hepatitis B if: ? You were born in a country where hepatitis B is common. Ask your health care provider which countries are considered high risk. ? Your parents were born in a high-risk country, and you have not been immunized against hepatitis B (hepatitis B vaccine). ? You have HIV or AIDS. ? You use needles to inject street drugs. ? You live with someone who has hepatitis B. ? You have had sex with someone who has hepatitis B. ? You get hemodialysis treatment. ? You take certain medicines for conditions, including cancer, organ transplantation, and autoimmune conditions.  Hepatitis C  Blood testing is recommended for: ? Everyone born from 1945 through 1965. ? Anyone with known risk factors for hepatitis C.  Sexually transmitted infections (STIs)  You should be screened for sexually transmitted infections (STIs) including gonorrhea and chlamydia if: ? You are sexually active and are younger than 31 years of age. ? You are older than 31 years of age and your health care provider tells you that you are at risk for this type of infection. ? Your sexual activity has changed since you were last screened and you are at an increased risk for chlamydia or gonorrhea. Ask your health care provider if you are at risk.  If you do not have HIV, but are at risk, it may be recommended that you take a prescription medicine daily to prevent HIV infection. This is called pre-exposure prophylaxis (PrEP). You are considered at risk if: ? You are sexually active and do not regularly use condoms  or know the HIV status of your partner(s). ? You take drugs by injection. ? You are sexually active with a partner who has HIV.  Talk with your health care provider about whether you are at high risk of being infected with HIV. If you choose to begin PrEP, you should first be tested for HIV. You should then be tested every 3 months for as long as you are taking PrEP. Pregnancy  If you are   premenopausal and you may become pregnant, ask your health care provider about preconception counseling.  If you may become pregnant, take 400 to 800 micrograms (mcg) of folic acid every day.  If you want to prevent pregnancy, talk to your health care provider about birth control (contraception). Osteoporosis and menopause  Osteoporosis is a disease in which the bones lose minerals and strength with aging. This can result in serious bone fractures. Your risk for osteoporosis can be identified using a bone density scan.  If you are 41 years of age or older, or if you are at risk for osteoporosis and fractures, ask your health care provider if you should be screened.  Ask your health care provider whether you should take a calcium or vitamin D supplement to lower your risk for osteoporosis.  Menopause may have certain physical symptoms and risks.  Hormone replacement therapy may reduce some of these symptoms and risks. Talk to your health care provider about whether hormone replacement therapy is right for you. Follow these instructions at home:  Schedule regular health, dental, and eye exams.  Stay current with your immunizations.  Do not use any tobacco products including cigarettes, chewing tobacco, or electronic cigarettes.  If you are pregnant, do not drink alcohol.  If you are breastfeeding, limit how much and how often you drink alcohol.  Limit alcohol intake to no more than 1 drink per day for nonpregnant women. One drink equals 12 ounces of beer, 5 ounces of wine, or 1 ounces of hard  liquor.  Do not use street drugs.  Do not share needles.  Ask your health care provider for help if you need support or information about quitting drugs.  Tell your health care provider if you often feel depressed.  Tell your health care provider if you have ever been abused or do not feel safe at home. This information is not intended to replace advice given to you by your health care provider. Make sure you discuss any questions you have with your health care provider. Document Released: 10/22/2010 Document Revised: 09/14/2015 Document Reviewed: 01/10/2015 Elsevier Interactive Patient Education  [employer].

## 2017-12-02 ENCOUNTER — Telehealth: Payer: Self-pay | Admitting: Physician Assistant

## 2017-12-02 ENCOUNTER — Ambulatory Visit: Payer: BLUE CROSS/BLUE SHIELD | Admitting: Physician Assistant

## 2017-12-02 NOTE — Telephone Encounter (Signed)
See note

## 2017-12-02 NOTE — Telephone Encounter (Signed)
Copied from CRM [phone]. Topic: Quick Communication - See Telephone Encounter >> Dec 02, 2017  3:25 PM Burchel, Abbi R wrote: CRM for notification. See Telephone encounter for: 12/02/17.  Pt would like to know if she could increase her dosage of anxiety meds as she is experiencing more severe sx, or if there is any OTC treatments she might could try?  Pt: [phone]

## 2017-12-03 NOTE — Telephone Encounter (Signed)
Patient was spoken to by Marlowe AschoffAria Womble, CMA.   Appointment made on 12/09/17.  Jarold MottoSamantha Tarik Teixeira PA-C

## 2017-12-03 NOTE — Telephone Encounter (Signed)
Please see message and advise 

## 2017-12-09 ENCOUNTER — Ambulatory Visit (INDEPENDENT_AMBULATORY_CARE_PROVIDER_SITE_OTHER): Payer: 59 | Admitting: Physician Assistant

## 2017-12-09 ENCOUNTER — Encounter: Payer: Self-pay | Admitting: Physician Assistant

## 2017-12-09 VITALS — BP 112/78 | HR 74 | Temp 98.6°F | Ht 63.0 in | Wt 122.0 lb

## 2017-12-09 DIAGNOSIS — F419 Anxiety disorder, unspecified: Secondary | ICD-10-CM | POA: Diagnosis not present

## 2017-12-09 DIAGNOSIS — K5900 Constipation, unspecified: Secondary | ICD-10-CM

## 2017-12-09 MED ORDER — SERTRALINE HCL 25 MG PO TABS: 25.0000 mg | ORAL_TABLET | Freq: Every day | ORAL | 2 refills | Status: DC

## 2017-12-09 MED ORDER — BUSPIRONE HCL 7.5 MG PO TABS: 7.5000 mg | ORAL_TABLET | Freq: Three times a day (TID) | ORAL | 2 refills | Status: AC | PRN

## 2017-12-09 MED ORDER — DICYCLOMINE HCL 10 MG PO CAPS: 10.0000 mg | ORAL_CAPSULE | Freq: Three times a day (TID) | ORAL | 2 refills | Status: DC | PRN

## 2017-12-09 NOTE — Patient Instructions (Signed)
It was great to see you!  Start bowel regimen. Take Bentyl as needed.  Start Zoloft 25 mg daily. Continue Buspar as needed.  Let's follow-up in 3 months, sooner if you have concerns.  Take care,  Jarold MottoSamantha Taryll Reichenberger PA-C

## 2017-12-09 NOTE — Progress Notes (Signed)
Betty Gentry is a 32 y.o. female here for a follow up of a pre-existing problem.  History of Present Illness:   Chief Complaint  Patient presents with  . Anxiety  . Abdominal Pain  . Constipation    HPI   Anxiety Was on Celexa in the past, but wasn't on it long enough to notice if it works. Currently taking the Buspar TID every day. Getting more moody and irritable with each period. 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 She has a good support system with her parents. She denies panic attacks.  Constipation Normal BM schedule q 2-3 days. Had stress-induced gastritis after having her baby. Will take occasional stool softener. She has been on bentyl in the past with good relief. Was on Align for a little bit but didn't take it long enough to notice significant benefits. Has type 5 and 6 Bristol Stools when she does has BM. In regards to fiber intake, she has a good amount of fruits and vegetables. Denies blood in stools. Denies prior hx of IBS dx. She is having intermittent abdominal discomfort "feels like a baby kicking around."  GAD 7 : Generalized Anxiety Score 12/09/2017 10/17/2017  Nervous, Anxious, on Edge 2 1  Control/stop worrying 2 1  Worry too much - different things 2 1  Trouble relaxing 2 1  Restless 1 0  Easily annoyed or irritable 3 3  Afraid - awful might happen 1 1  Total GAD 7 Score 13 8     Past Medical History:  Diagnosis Date  . History of chicken pox   . Vaginal delivery 03/18/2017  . Vaginal Pap smear, abnormal      Social History   Socioeconomic History  . Marital status: Single    Spouse name: Not on file  . Number of children: Not on file  . Years of education: Not on file  . Highest education level: Not on file  Occupational History  . Not on file  Social Needs  . Financial resource strain: Not on file  . Food insecurity:    Worry: Not on file    Inability:  Not on file  . Transportation needs:    Medical: Not on file    Non-medical: Not on file  Tobacco Use  . Smoking status: Never Smoker  . Smokeless tobacco: Never Used  Substance and Sexual Activity  . Alcohol use: No    Frequency: Never  . Drug use: No  . Sexual activity: Yes    Birth control/protection: Pill  Lifestyle  . Physical activity:    Days per week: Not on file    Minutes per session: Not on file  . Stress: Not on file  Relationships  . Social connections:    Talks on phone: Not on file    Gets together: Not on file    Attends religious service: Not on file    Active member of club or organization: Not on file    Attends meetings of clubs or organizations: Not on file    Relationship status: Not on file  . Intimate partner violence:    Fear of current or ex partner: Not on file    Emotionally abused: Not on file    Physically abused: Not on file    Forced sexual activity: Not on file  Other Topics Concern  . Not on file  Social History Narrative  New mama    Past Surgical History:  Procedure Laterality Date  . BELPHAROPTOSIS REPAIR    . KNEE SURGERY    . LEEP      Family History  Problem Relation Age of Onset  . Hypertension Father   . High Cholesterol Father   . Heart attack Paternal Grandmother   . Lung cancer Paternal Grandmother   . Asthma Mother   . Arthritis Maternal Grandmother   . Heart disease Maternal Grandmother   . Heart attack Maternal Grandmother     Allergies  Allergen Reactions  . Erythromycin Nausea Only  . Sulfa Antibiotics Nausea Only  . Tetracyclines & Related Nausea Only    Current Medications:   Current Outpatient Medications:  .  acetaminophen (TYLENOL) 325 MG tablet, Take 650 mg by mouth every 6 (six) hours as needed., Disp: , Rfl:  .  busPIRone (BUSPAR) 7.5 MG tablet, Take 1 tablet (7.5 mg total) by mouth 3 (three) times daily as needed., Disp: 60 tablet, Rfl: 2 .  dicyclomine (BENTYL) 10 MG capsule, Take 1  capsule (10 mg total) by mouth 3 (three) times daily as needed for spasms., Disp: 30 capsule, Rfl: 2 .  meloxicam (MOBIC) 15 MG tablet, Mobic 15 mg tablet  Take 1 tablet every day by oral route as needed., Disp: , Rfl:  .  norgestimate-ethinyl estradiol (SPRINTEC 28) 0.25-35 MG-MCG tablet, Sprintec (28) 0.25 mg-35 mcg tablet  TAKE 1 TABLET BY MOUTH EVERY DAY, Disp: , Rfl:  .  omeprazole (PRILOSEC OTC) 20 MG tablet, Take 20 mg by mouth daily., Disp: , Rfl:  .  sertraline (ZOLOFT) 25 MG tablet, Take 1 tablet (25 mg total) by mouth daily., Disp: 30 tablet, Rfl: 2 .  triamcinolone cream (KENALOG) 0.1 %, triamcinolone acetonide 0.1 % topical cream  APPLY TO AFFECTED AREA TWICE A DAY UNTIL RASH CLEARS, Disp: , Rfl:    Review of Systems:   ROS  Negative unless otherwise specified per HPI.  Vitals:   Vitals:   12/09/17 1532  BP: 112/78  Pulse: 74  Temp: 98.6 F (37 C)  TempSrc: Oral  Weight: 122 lb (55.3 kg)  Height: 5\' 3"  (1.6 m)     Body mass index is 21.61 kg/m.  Physical Exam:   Physical Exam  Constitutional: She appears well-developed. She is cooperative.  Non-toxic appearance. She does not have a sickly appearance. She does not appear ill. No distress.  Cardiovascular: Normal rate, regular rhythm, S1 normal, S2 normal, normal heart sounds and normal pulses.  No LE edema  Pulmonary/Chest: Effort normal and breath sounds normal.  Abdominal: Normal appearance and bowel sounds are normal. There is no tenderness. There is no rigidity, no rebound, no guarding, no CVA tenderness, no tenderness at McBurney's point and negative Murphy's sign.  Neurological: She is alert. GCS eye subscore is 4. GCS verbal subscore is 5. GCS motor subscore is 6.  Skin: Skin is warm, dry and intact.  Psychiatric: She has a normal mood and affect. Her speech is normal and behavior is normal.  Nursing note and vitals reviewed.   Assessment and Plan:    Problem List Items Addressed This Visit      Other    Anxiety - Primary    Uncontrolled. Add Zoloft 25 mg today. May need to increase. Discussed that if she would like, we could try to have her just take around her period, but for now, take daily. Continue Buspar prn. Follow-up within 3 months, sooner if needed.  Relevant Medications   busPIRone (BUSPAR) 7.5 MG tablet   sertraline (ZOLOFT) 25 MG tablet   Constipation    Uncontrolled. Possible IBS? Start Bentyl 10mg  TID prn for abd pain. Push high fiber foods and water. Start bowel regimen, handout provided. Follow-up in 3 months, sooner if needed.         . Reviewed expectations re: course of current medical issues. . Discussed self-management of symptoms. . Outlined signs and symptoms indicating need for more acute intervention. . Patient verbalized understanding and all questions were answered. . See orders for this visit as documented in the electronic medical record. . Patient received an After-Visit Summary.  Jarold MottoSamantha Akeylah Hendel, PA-C

## 2017-12-10 ENCOUNTER — Encounter: Payer: Self-pay | Admitting: Physician Assistant

## 2017-12-10 DIAGNOSIS — K5900 Constipation, unspecified: Secondary | ICD-10-CM | POA: Insufficient documentation

## 2017-12-10 NOTE — Assessment & Plan Note (Signed)
Uncontrolled. Possible IBS? Start Bentyl 10mg  TID prn for abd pain. Push high fiber foods and water. Start bowel regimen, handout provided. Follow-up in 3 months, sooner if needed.

## 2017-12-10 NOTE — Assessment & Plan Note (Signed)
Uncontrolled. Add Zoloft 25 mg today. May need to increase. Discussed that if she would like, we could try to have her just take around her period, but for now, take daily. Continue Buspar prn. Follow-up within 3 months, sooner if needed.

## 2017-12-23 ENCOUNTER — Ambulatory Visit: Payer: BLUE CROSS/BLUE SHIELD | Admitting: Physician Assistant

## 2017-12-24 ENCOUNTER — Ambulatory Visit (INDEPENDENT_AMBULATORY_CARE_PROVIDER_SITE_OTHER): Payer: 59 | Admitting: Physician Assistant

## 2017-12-24 ENCOUNTER — Encounter: Payer: Self-pay | Admitting: Physician Assistant

## 2017-12-24 VITALS — BP 110/70 | HR 77 | Temp 98.9°F | Ht 63.0 in | Wt 118.0 lb

## 2017-12-24 DIAGNOSIS — J069 Acute upper respiratory infection, unspecified: Secondary | ICD-10-CM | POA: Diagnosis not present

## 2017-12-24 MED ORDER — HYDROCOD POLST-CPM POLST ER 10-8 MG/5ML PO SUER: 5.0000 mL | Freq: Every evening | ORAL | 0 refills | Status: DC | PRN

## 2017-12-24 MED ORDER — AZITHROMYCIN 250 MG PO TABS: ORAL_TABLET | ORAL | 0 refills | Status: DC

## 2017-12-24 NOTE — Patient Instructions (Signed)
It was great to see you!  Use medication as prescribed: Tussionex cough syrup and Azithromycin  Push fluids and get plenty of rest. Please return if you are not improving as expected, or if you have high fevers (>101.5) or difficulty swallowing or worsening productive cough.  Call clinic with questions.  I hope you start feeling better soon!

## 2017-12-24 NOTE — Progress Notes (Signed)
Betty Gentry is a 32 y.o. female here for a new problem.  I acted as a Neurosurgeon for [employer], PA-C Corky Mull, LPN  History of Present Illness:   Chief Complaint  Patient presents with  . Sore Throat    Sore Throat   This is a new (Pt was seen at [facility] yesterday . Checked for strep and Flu both were Negative.) problem. Episode onset: Started Monday. The problem has been gradually worsening. The pain is worse on the left side. Maximum temperature: Fever yesterday 100. The pain is at a severity of 7/10. The pain is moderate. Associated symptoms include congestion, coughing, ear pain (Left ), headaches, a hoarse voice, a plugged ear sensation, trouble swallowing and vomiting (Yesterday from Lidocaine gargle). Pertinent negatives include no ear discharge, neck pain, shortness of breath or swollen glands. She has tried gargles and acetaminophen (Benadryl) for the symptoms. The treatment provided no relief.   Daughter is sick with ear infection, attends day care.  [facility] and [facility] for strep and flu and was negative to both.   Past Medical History:  Diagnosis Date  . History of chicken pox   . Vaginal delivery 03/18/2017  . Vaginal Pap smear, abnormal      Social History   Socioeconomic History  . Marital status: Single    Spouse name: Not on file  . Number of children: Not on file  . Years of education: Not on file  . Highest education level: Not on file  Occupational History  . Not on file  Social Needs  . Financial resource strain: Not on file  . Food insecurity:    Worry: Not on file    Inability: Not on file  . Transportation needs:    Medical: Not on file    Non-medical: Not on file  Tobacco Use  . Smoking status: Never Smoker  . Smokeless tobacco: Never Used  Substance and Sexual Activity  . Alcohol use: No    Frequency: Never  . Drug use: No  . Sexual activity: Yes    Birth control/protection: Pill   Lifestyle  . Physical activity:    Days per week: Not on file    Minutes per session: Not on file  . Stress: Not on file  Relationships  . Social connections:    Talks on phone: Not on file    Gets together: Not on file    Attends religious service: Not on file    Active member of club or organization: Not on file    Attends meetings of clubs or organizations: Not on file    Relationship status: Not on file  . Intimate partner violence:    Fear of current or ex partner: Not on file    Emotionally abused: Not on file    Physically abused: Not on file    Forced sexual activity: Not on file  Other Topics Concern  . Not on file  Social History Narrative   New mama    Past Surgical History:  Procedure Laterality Date  . BELPHAROPTOSIS REPAIR    . KNEE SURGERY    . LEEP      Family History  Problem Relation Age of Onset  . Hypertension Father   . High Cholesterol Father   . Heart attack Paternal Grandmother   . Lung cancer Paternal Grandmother   . Asthma Mother   . Arthritis Maternal Grandmother   . Heart disease Maternal Grandmother   .  Heart attack Maternal Grandmother     Allergies  Allergen Reactions  . Erythromycin Nausea Only  . Sulfa Antibiotics Nausea Only  . Tetracyclines & Related Nausea Only    Current Medications:   Current Outpatient Medications:  .  acetaminophen (TYLENOL) 325 MG tablet, Take 650 mg by mouth every 6 (six) hours as needed., Disp: , Rfl:  .  busPIRone (BUSPAR) 7.5 MG tablet, Take 1 tablet (7.5 mg total) by mouth 3 (three) times daily as needed., Disp: 60 tablet, Rfl: 2 .  dicyclomine (BENTYL) 10 MG capsule, Take 1 capsule (10 mg total) by mouth 3 (three) times daily as needed for spasms., Disp: 30 capsule, Rfl: 2 .  meloxicam (MOBIC) 15 MG tablet, Mobic 15 mg tablet  Take 1 tablet every day by oral route as needed., Disp: , Rfl:  .  norgestimate-ethinyl estradiol (SPRINTEC 28) 0.25-35 MG-MCG tablet, Sprintec (28) 0.25 mg-35 mcg tablet   TAKE 1 TABLET BY MOUTH EVERY DAY, Disp: , Rfl:  .  omeprazole (PRILOSEC OTC) 20 MG tablet, Take 20 mg by mouth daily., Disp: , Rfl:  .  sertraline (ZOLOFT) 25 MG tablet, Take 1 tablet (25 mg total) by mouth daily., Disp: 30 tablet, Rfl: 2 .  azithromycin (ZITHROMAX) 250 MG tablet, Take two tablets on day 1 and then one tablet daily x 4 days, Disp: 6 tablet, Rfl: 0 .  chlorpheniramine-HYDROcodone (TUSSIONEX PENNKINETIC ER) 10-8 MG/5ML SUER, Take 5 mLs by mouth at bedtime as needed for cough., Disp: 140 mL, Rfl: 0   Review of Systems:   Review of Systems  HENT: Positive for congestion, ear pain (Left ), hoarse voice and trouble swallowing. Negative for ear discharge.   Respiratory: Positive for cough. Negative for shortness of breath.   Gastrointestinal: Positive for vomiting (Yesterday from Lidocaine gargle).  Musculoskeletal: Negative for neck pain.  Neurological: Positive for headaches.    Vitals:   Vitals:   12/24/17 0900  BP: 110/70  Pulse: 77  Temp: 98.9 F (37.2 C)  TempSrc: Oral  SpO2: 99%  Weight: 118 lb (53.5 kg)  Height: 5\' 3"  (1.6 m)     Body mass index is 20.9 kg/m.  Physical Exam:   Physical Exam  Constitutional: She appears well-developed. She is cooperative.  Non-toxic appearance. She does not have a sickly appearance. She does not appear ill. No distress.  Slightly productive, harsh cough throughout exam  HENT:  Head: Normocephalic and atraumatic.  Right Ear: Tympanic membrane, external ear and ear canal normal. Tympanic membrane is not erythematous, not retracted and not bulging.  Left Ear: Tympanic membrane, external ear and ear canal normal. Tympanic membrane is not erythematous, not retracted and not bulging.  Nose: Mucosal edema and rhinorrhea present. Right sinus exhibits no maxillary sinus tenderness and no frontal sinus tenderness. Left sinus exhibits no maxillary sinus tenderness and no frontal sinus tenderness.  Mouth/Throat: Uvula is midline and  mucous membranes are normal. Posterior oropharyngeal erythema present. No posterior oropharyngeal edema. Tonsils are 0 on the right. Tonsils are 0 on the left. No tonsillar exudate.  Eyes: Conjunctivae and lids are normal.  Neck: Trachea normal.  Cardiovascular: Normal rate, regular rhythm, S1 normal, S2 normal and normal heart sounds.  Pulmonary/Chest: Effort normal and breath sounds normal. She has no decreased breath sounds. She has no wheezes. She has no rhonchi. She has no rales.  Lymphadenopathy:    She has no cervical adenopathy.  Neurological: She is alert.  Skin: Skin is warm, dry and intact.  Psychiatric: She has a normal mood and affect. Her speech is normal and behavior is normal.  Nursing note and vitals reviewed.   Assessment and Plan:    Icess was seen today for sore throat.  Diagnoses and all orders for this visit:  Upper respiratory tract infection, unspecified type  Other orders -     azithromycin (ZITHROMAX) 250 MG tablet; Take two tablets on day 1 and then one tablet daily x 4 days -     chlorpheniramine-HYDROcodone (TUSSIONEX PENNKINETIC ER) 10-8 MG/5ML SUER; Take 5 mLs by mouth at bedtime as needed for cough.   No red flags on exam.  Will initiate Azithromycin and Tussionex per orders. Discussed taking medications as prescribed. Reviewed return precautions including worsening fever, SOB, worsening cough or other concerns. Push fluids and rest. I recommend that patient follow-up if symptoms worsen or persist despite treatment x 7-10 days, sooner if needed.  . Reviewed expectations re: course of current medical issues. . Discussed self-management of symptoms. . Outlined signs and symptoms indicating need for more acute intervention. . Patient verbalized understanding and all questions were answered. . See orders for this visit as documented in the electronic medical record. . Patient received an After-Visit Summary.  Jarold Motto, PA-C

## 2018-01-05 ENCOUNTER — Other Ambulatory Visit: Payer: Self-pay | Admitting: Physician Assistant

## 2018-01-23 DIAGNOSIS — Z3041 Encounter for surveillance of contraceptive pills: Secondary | ICD-10-CM | POA: Diagnosis not present

## 2018-01-23 DIAGNOSIS — R1031 Right lower quadrant pain: Secondary | ICD-10-CM | POA: Diagnosis not present

## 2018-01-28 ENCOUNTER — Other Ambulatory Visit: Payer: Self-pay | Admitting: Physician Assistant

## 2018-02-18 ENCOUNTER — Encounter: Payer: Self-pay | Admitting: Physician Assistant

## 2018-02-18 ENCOUNTER — Ambulatory Visit (INDEPENDENT_AMBULATORY_CARE_PROVIDER_SITE_OTHER): Payer: 59 | Admitting: Physician Assistant

## 2018-02-18 ENCOUNTER — Ambulatory Visit: Payer: Self-pay

## 2018-02-18 VITALS — BP 104/72 | HR 82 | Temp 98.4°F | Resp 16 | Wt 119.0 lb

## 2018-02-18 DIAGNOSIS — R509 Fever, unspecified: Secondary | ICD-10-CM | POA: Diagnosis not present

## 2018-02-18 DIAGNOSIS — J029 Acute pharyngitis, unspecified: Secondary | ICD-10-CM | POA: Diagnosis not present

## 2018-02-18 LAB — POCT RAPID STREP A (OFFICE): Rapid Strep A Screen: NEGATIVE

## 2018-02-18 LAB — POCT INFLUENZA A/B
INFLUENZA A, POC: NEGATIVE
Influenza B, POC: NEGATIVE

## 2018-02-18 NOTE — Progress Notes (Signed)
Betty Gentry is a 32 y.o. female here for a new problem.  History of Present Illness:   Chief Complaint  Patient presents with  . Sore Throat    HPI  Patient developed sore throat, fever and chills over last night. Denies: chest pain, neck pain/stiffness, SOB, painful urination, changes in appetite. She denies any lesions in mouth or on palms of hands or soles of feet.  Daughter developed fever of 104 over the weekend and has confirmed HFMD.    Past Medical History:  Diagnosis Date  . History of chicken pox   . Vaginal delivery 03/18/2017  . Vaginal Pap smear, abnormal      Social History   Socioeconomic History  . Marital status: Single    Spouse name: Not on file  . Number of children: Not on file  . Years of education: Not on file  . Highest education level: Not on file  Occupational History  . Not on file  Social Needs  . Financial resource strain: Not on file  . Food insecurity:    Worry: Not on file    Inability: Not on file  . Transportation needs:    Medical: Not on file    Non-medical: Not on file  Tobacco Use  . Smoking status: Never Smoker  . Smokeless tobacco: Never Used  Substance and Sexual Activity  . Alcohol use: No    Frequency: Never  . Drug use: No  . Sexual activity: Yes    Birth control/protection: Pill  Lifestyle  . Physical activity:    Days per week: Not on file    Minutes per session: Not on file  . Stress: Not on file  Relationships  . Social connections:    Talks on phone: Not on file    Gets together: Not on file    Attends religious service: Not on file    Active member of club or organization: Not on file    Attends meetings of clubs or organizations: Not on file    Relationship status: Not on file  . Intimate partner violence:    Fear of current or ex partner: Not on file    Emotionally abused: Not on file    Physically abused: Not on file    Forced sexual activity: Not on file  Other Topics Concern  . Not on  file  Social History Narrative   New mama    Past Surgical History:  Procedure Laterality Date  . BELPHAROPTOSIS REPAIR    . KNEE SURGERY    . LEEP      Family History  Problem Relation Age of Onset  . Hypertension Father   . High Cholesterol Father   . Heart attack Paternal Grandmother   . Lung cancer Paternal Grandmother   . Asthma Mother   . Arthritis Maternal Grandmother   . Heart disease Maternal Grandmother   . Heart attack Maternal Grandmother     Allergies  Allergen Reactions  . Erythromycin Nausea Only  . Sulfa Antibiotics Nausea Only  . Tetracyclines & Related Nausea Only    Current Medications:   Current Outpatient Medications:  .  acetaminophen (TYLENOL) 325 MG tablet, Take 650 mg by mouth every 6 (six) hours as needed., Disp: , Rfl:  .  dicyclomine (BENTYL) 10 MG capsule, Take 1 capsule (10 mg total) by mouth 3 (three) times daily as needed for spasms., Disp: 30 capsule, Rfl: 2 .  meloxicam (MOBIC) 15 MG tablet, Mobic 15 mg tablet  Take 1 tablet every day by oral route as needed., Disp: , Rfl:  .  norgestimate-ethinyl estradiol (SPRINTEC 28) 0.25-35 MG-MCG tablet, Sprintec (28) 0.25 mg-35 mcg tablet  TAKE 1 TABLET BY MOUTH EVERY DAY, Disp: , Rfl:  .  omeprazole (PRILOSEC OTC) 20 MG tablet, Take 20 mg by mouth daily., Disp: , Rfl:    Review of Systems:   ROS  Negative unless otherwise specified per HPI.  Vitals:   Vitals:   02/18/18 1610  BP: 104/72  Pulse: 82  Resp: 16  Temp: 98.4 F (36.9 C)  TempSrc: Oral  SpO2: 99%  Weight: 119 lb (54 kg)     Body mass index is 21.08 kg/m.  Physical Exam:   Physical Exam  Constitutional: She appears well-developed. She is cooperative.  Non-toxic appearance. She does not have a sickly appearance. She does not appear ill. No distress.  HENT:  Head: Normocephalic and atraumatic.  Right Ear: Tympanic membrane, external ear and ear canal normal. Tympanic membrane is not erythematous, not retracted and not  bulging.  Left Ear: Tympanic membrane, external ear and ear canal normal. Tympanic membrane is not erythematous, not retracted and not bulging.  Nose: Mucosal edema present. Right sinus exhibits no maxillary sinus tenderness and no frontal sinus tenderness. Left sinus exhibits no maxillary sinus tenderness and no frontal sinus tenderness.  Mouth/Throat: Uvula is midline and mucous membranes are normal. Posterior oropharyngeal erythema present. No posterior oropharyngeal edema. Tonsils are 0 on the right. Tonsils are 0 on the left.  Eyes: Conjunctivae and lids are normal.  Neck: Trachea normal.  Cardiovascular: Normal rate, regular rhythm, S1 normal, S2 normal and normal heart sounds.  Pulmonary/Chest: Effort normal and breath sounds normal. She has no decreased breath sounds. She has no wheezes. She has no rhonchi. She has no rales.  Lymphadenopathy:    She has no cervical adenopathy.  Neurological: She is alert.  Skin: Skin is warm, dry and intact.  Psychiatric: She has a normal mood and affect. Her speech is normal and behavior is normal.  Nursing note and vitals reviewed.   Results for orders placed or performed in visit on 02/18/18  POCT Influenza A/B  Result Value Ref Range   Influenza A, POC Negative Negative   Influenza B, POC Negative Negative  POCT rapid strep A  Result Value Ref Range   Rapid Strep A Screen Negative Negative    Assessment and Plan:    Odaliz was seen today for sore throat.  Diagnoses and all orders for this visit:  Sore throat and Fever, unspecified fever cause No red flags on exam.  Flu test and strep test are negative. Suspect viral illness. Continue supportive care. Discussed taking medications as prescribed. Reviewed return precautions including worsening fever, SOB, worsening cough or other concerns. Push fluids and rest. I recommend that patient follow-up if symptoms worsen or persist despite treatment x 7-10 days, sooner if needed. -     POCT  Influenza A/B  . Reviewed expectations re: course of current medical issues. . Discussed self-management of symptoms. . Outlined signs and symptoms indicating need for more acute intervention. . Patient verbalized understanding and all questions were answered. . See orders for this visit as documented in the electronic medical record. . Patient received an After-Visit Summary.  Jarold Motto, PA-C

## 2018-02-18 NOTE — Patient Instructions (Signed)
It was great to see you!  You have a viral upper infection.  Antibiotics are not needed for this.  Viral infections usually take 7-10 days to resolve.    Push fluids and get plenty of rest. Please return if you are not improving as expected, or if you have high fevers (>101.5) or difficulty swallowing or worsening productive cough.  Call clinic with questions.  I hope you start feeling better soon!

## 2018-02-18 NOTE — Telephone Encounter (Signed)
Pt called to report she is having fevers since last evening. 104 last night. She has come down to 103.2 with 1000mg  tylenol. She reports a sore throat and chills. Pt daughter has been diagnosed with hand foot mouth recently and is concerned she my have contracted this. Pt denies nausea and states she has a slight headache. Appointment today per protocol. Care advice read to patient. Pt verbalized understanding of all instructions. Reason for Disposition . Fever > 104 F (40 C)  Answer Assessment - Initial Assessment Questions 1. TEMPERATURE: "What is the most recent temperature?"  "How was it measured?" forehead scanner      2. ONSET: "When did the fever start?" chills in evening     11pm last night 3. SYMPTOMS: "Do you have any other symptoms besides the fever?"  (e.g., colds, headache, sore throat, earache, cough, rash, diarrhea, vomiting, abdominal pain)     Sore throat and headache 4. CAUSE: If there are no symptoms, ask: "What do you think is causing the fever?"      N/A daughter Hand foot mouth now 5. CONTACTS: "Does anyone else in the family have an infection?"     daughter 39. TREATMENT: "What have you done so far to treat this fever?" (e.g., medications)     Tylenol 1000 this AM 7. IMMUNOCOMPROMISE: "Do you have of the following: diabetes, HIV positive, splenectomy, cancer chemotherapy, chronic steroid treatment, transplant patient, etc."     no 8. PREGNANCY: "Is there any chance you are pregnant?" "When was your last menstrual period?"     No on mensus now 9. TRAVEL: "Have you traveled out of the country in the last month?" (e.g., travel history, exposures)     no  Protocols used: FEVER-A-AH

## 2018-02-18 NOTE — Telephone Encounter (Signed)
See note

## 2018-03-11 ENCOUNTER — Ambulatory Visit: Payer: BLUE CROSS/BLUE SHIELD | Admitting: Physician Assistant

## 2018-03-11 DIAGNOSIS — Z0289 Encounter for other administrative examinations: Secondary | ICD-10-CM

## 2018-03-12 ENCOUNTER — Encounter: Payer: Self-pay | Admitting: Physician Assistant

## 2018-03-20 ENCOUNTER — Other Ambulatory Visit: Payer: Self-pay | Admitting: Physician Assistant

## 2018-03-25 DIAGNOSIS — N911 Secondary amenorrhea: Secondary | ICD-10-CM | POA: Diagnosis not present

## 2018-04-02 DIAGNOSIS — N911 Secondary amenorrhea: Secondary | ICD-10-CM | POA: Diagnosis not present

## 2018-04-06 DIAGNOSIS — N911 Secondary amenorrhea: Secondary | ICD-10-CM | POA: Diagnosis not present

## 2018-04-23 DIAGNOSIS — Z3481 Encounter for supervision of other normal pregnancy, first trimester: Secondary | ICD-10-CM | POA: Diagnosis not present

## 2018-04-23 DIAGNOSIS — Z3685 Encounter for antenatal screening for Streptococcus B: Secondary | ICD-10-CM | POA: Diagnosis not present

## 2018-04-23 LAB — OB RESULTS CONSOLE ANTIBODY SCREEN: Antibody Screen: POSITIVE

## 2018-04-23 LAB — OB RESULTS CONSOLE GC/CHLAMYDIA
Chlamydia: NEGATIVE
Gonorrhea: NEGATIVE

## 2018-04-23 LAB — OB RESULTS CONSOLE RPR: RPR: NONREACTIVE

## 2018-04-23 LAB — OB RESULTS CONSOLE ABO/RH: RH Type: NEGATIVE

## 2018-04-23 LAB — OB RESULTS CONSOLE RUBELLA ANTIBODY, IGM: Rubella: IMMUNE

## 2018-04-23 LAB — OB RESULTS CONSOLE HEPATITIS B SURFACE ANTIGEN: Hepatitis B Surface Ag: NEGATIVE

## 2018-04-23 LAB — OB RESULTS CONSOLE HIV ANTIBODY (ROUTINE TESTING): HIV: NONREACTIVE

## 2018-05-08 ENCOUNTER — Ambulatory Visit: Payer: Self-pay

## 2018-05-08 NOTE — Telephone Encounter (Signed)
Pt. reported cold symptoms with stuffy/ runny nose, scratchy throat, cough, ear congestion, and some sinus pressure since Sunday, 1/12.  Denied fever,  shortness of breath, or body aches.  Has been using Delsym and Mucinex OTC.  Symptoms are not improving, and pt. Questioning if safe to continue taking above medications. Advised, per literature, both are considered "Category C" with use in pregnancy; need to weigh benefits vs. risk.   Appt. Given at Saturday clinic for eval. And recommendations for continued treatment, since pt. Is pregnant.  Verb. Understanding Agreed wit plan.   (due to Saturday work schedule, appt. given for 11:00 AM, 1/18; approved by Dr. Panosh)           Reason for Disposition . Care advice for mild cough, questions about    Pt. with cold symptoms; asking about use of cough medications while pregnant.  Answer Assessment - Initial Assessment Questions 1. ONSET: "When did the nasal discharge start?"      Sunday, 1/12 2. AMOUNT: "How much discharge is there?"      Frequent runny nose; clear with slight yellow color 3. COUGH: "Do you have a cough?" If yes, ask: "Describe the color of your sputum" (clear, white, yellow, green)     Mostly dry; coughing up small amts. mucus  4. RESPIRATORY DISTRESS: "Describe your breathing."      Denied shortness of breath 5. FEVER: "Do you have a fever?" If so, ask: "What is your temperature, how was it measured, and when did it start?"     Denied fever  6. SEVERITY: "Overall, how bad are you feeling right now?" (e.g., doesn\'t interfere with normal activities, staying home from school/work, staying in bed)      Making herself go to work, but would rather be in bed  7. OTHER SYMPTOMS: "Do you have any other symptoms?" (e.g., sore throat, earache, wheezing, vomiting)    Ears feel congesting; scratchy throat, runny nose, cough, some sinus pressure across brow area  8. PREGNANCY: "Is there any chance you are pregnant?" "When was your last  menstrual period?"     [redacted]  weeks pregnant  Protocols used: COMMON COLD-A-AH  Message from Angela Nevin sent at 05/08/2018 11:18 AM EST   Summary: cough, congestion    Patient calling with complaints of cough, congestion and "general cold symptoms" since Sunday. Patient states that she is pregnant and is wanting to make sure it is okay to continue taking Delsym and Mucinex OTC. Offered patient an appointment but she declined due to work schedule. Please advise.

## 2018-05-08 NOTE — Progress Notes (Signed)
Chief Complaint  Patient presents with  . Sore Throat  . Nasal Congestion  . Cough    HPI: Betty Gentry 33 y.o. Patient comes in today for [facility] Saturday clinic for  new problem evaluation. Onset 5-6 fays ago of head cold and now coughing and  Nasal congestion  Problematic  Taking  deslym and not that helpful   Supposed to be using only limited  But cough at night   See nurse triage note Summary: cough, congestion    Patient calling with complaints of cough, congestion and "general cold symptoms" since Sunday. Patient states that she is pregnant and is wanting to make sure it is okay to continue taking Delsym and Mucinex OTC. Offered patient an appointment but she declined due to work schedule. Please advise.         onset 5 days  deslym and  [redacted] weeks pregnant .  Cough  ? Temp 100 .  Yesterday .  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 No cp sob   Past Medical History:  Diagnosis Date  . History of chicken pox   . Vaginal delivery 03/18/2017  . Vaginal Pap smear, abnormal     Family History  Problem Relation Age of Onset  . Hypertension Father   . High Cholesterol Father   . Heart attack Paternal Grandmother   . Lung cancer Paternal Grandmother   . Asthma Mother   . Arthritis Maternal Grandmother   . Heart disease Maternal Grandmother   . Heart attack Maternal Grandmother     Social History   Socioeconomic History  . Marital status: Single    Spouse name: Not on file  . Number of children: Not on file  . Years of education: Not on file  . Highest education level: Not on file  Occupational History  . Not on file  Social Needs  . Financial resource strain: Not on file  . Food insecurity:    Worry: Not on file    Inability: Not on file  . Transportation needs:    Medical: Not on file    Non-medical: Not on file  Tobacco Use  . Smoking status: Never Smoker  . Smokeless tobacco: Never Used    Substance and Sexual Activity  . Alcohol use: No    Frequency: Never  . Drug use: No  . Sexual activity: Yes    Birth control/protection: Pill  Lifestyle  . Physical activity:    Days per week: Not on file    Minutes per session: Not on file  . Stress: Not on file  Relationships  . Social connections:    Talks on phone: Not on file    Gets together: Not on file    Attends religious service: Not on file    Active member of club or organization: Not on file    Attends meetings of clubs or organizations: Not on file    Relationship status: Not on file  Other Topics Concern  . Not on file  Social History Narrative   New mama    Outpatient Medications Prior to Visit  Medication Sig Dispense Refill  . Prenatal Vit-Fe Fumarate-FA (PRENATAL MULTIVITAMIN) TABS tablet Take 1 tablet by mouth daily at 12 noon.    Marland Kitchen acetaminophen (TYLENOL) 325 MG tablet Take 650 mg by mouth every 6 (six) hours as needed.    . dicyclomine (BENTYL) 10 MG capsule Take 1  capsule (10 mg total) by mouth 3 (three) times daily as needed for spasms. 30 capsule 2  . meloxicam (MOBIC) 15 MG tablet Mobic 15 mg tablet  Take 1 tablet every day by oral route as needed.    . norgestimate-ethinyl estradiol (SPRINTEC 28) 0.25-35 MG-MCG tablet Sprintec (28) 0.25 mg-35 mcg tablet  TAKE 1 TABLET BY MOUTH EVERY DAY    . omeprazole (PRILOSEC OTC) 20 MG tablet Take 20 mg by mouth daily.     No facility-administered medications prior to visit.      EXAM:  BP 112/76 (BP Location: Left Arm, Patient Position: Sitting, Cuff Size: Normal)   Pulse 72   Temp 98 F (36.7 C) (Oral)   Ht 5\' 3"  (1.6 m)   Wt 121 lb (54.9 kg)   LMP 12/02/2017   SpO2 99%   BMI 21.43 kg/m   Body mass index is 21.43 kg/m. WDWN in NAD  quiet respirations; very nasally congested  somewhat hoarse. Non toxic . HEENT: Normocephalic ;atraumatic , Eyes;  PERRL, EOMs  Full, lids and conjunctiva clear,,Ears: no deformities, canals nl, TM landmarks  normal, Nose: no deformity or discharge but congested;face minimally tender Mouth : OP clear without lesion or edema . Neck: Supple without adenopathy or masses or bruits Chest:  Clear to A without wheezes rales or rhonchi ocass cough  CV:  S1-S2 no gallops or murmurs peripheral perfusion is normal Skin :nl perfusion and no acute rashes     BP Readings from Last 3 Encounters:  05/09/18 112/76  02/18/18 104/72  12/24/17 110/70    ASSESSMENT AND PLAN:  Discussed the following assessment and plan:  Acute upper respiratory infection of multiple sites  [redacted] weeks gestation of pregnancy reviewed list from [facility]  limited use   -Patient advised to return or notify health care team  if  new concerns arise.  Patient Instructions  Your lung exam is normal  Today  . Agree this is a viral respiratory infection  Limit use of afrin to  Avoid  Nasal congestion rebound   Hot liquids tea and honey may be helpful  Could try short course  If ok with ob new cough med   If getting  Regular fever need reevaluation      Burna Mortimer K. Panosh M.D.

## 2018-05-09 ENCOUNTER — Encounter: Payer: Self-pay | Admitting: Internal Medicine

## 2018-05-09 ENCOUNTER — Ambulatory Visit (INDEPENDENT_AMBULATORY_CARE_PROVIDER_SITE_OTHER): Payer: 59 | Admitting: Internal Medicine

## 2018-05-09 VITALS — BP 112/76 | HR 72 | Temp 98.0°F | Ht 63.0 in | Wt 121.0 lb

## 2018-05-09 DIAGNOSIS — Z3A12 12 weeks gestation of pregnancy: Secondary | ICD-10-CM

## 2018-05-09 DIAGNOSIS — J069 Acute upper respiratory infection, unspecified: Secondary | ICD-10-CM | POA: Diagnosis not present

## 2018-05-09 MED ORDER — PROMETHAZINE-DM 6.25-15 MG/5ML PO SYRP: 2.5000 mL | ORAL_SOLUTION | Freq: Four times a day (QID) | ORAL | 0 refills | Status: DC | PRN

## 2018-05-09 NOTE — Patient Instructions (Addendum)
Your lung exam is normal  Today  . Agree this is a viral respiratory infection  Limit use of afrin to  Avoid  Nasal congestion rebound   Hot liquids tea and honey may be helpful  Could try short course  If ok with ob new cough med   If getting  Regular fever need reevaluation

## 2018-05-13 DIAGNOSIS — Z3A12 12 weeks gestation of pregnancy: Secondary | ICD-10-CM | POA: Diagnosis not present

## 2018-05-13 DIAGNOSIS — Z3682 Encounter for antenatal screening for nuchal translucency: Secondary | ICD-10-CM | POA: Diagnosis not present

## 2018-06-14 ENCOUNTER — Encounter (HOSPITAL_COMMUNITY): Payer: Self-pay | Admitting: *Deleted

## 2018-06-14 ENCOUNTER — Inpatient Hospital Stay (HOSPITAL_COMMUNITY)
Admission: EM | Admit: 2018-06-14 | Discharge: 2018-06-14 | Disposition: A | Discharge status: Home / Self Care | Payer: 59 | Attending: Obstetrics and Gynecology | Admitting: Obstetrics and Gynecology

## 2018-06-14 ENCOUNTER — Telehealth: Payer: Self-pay | Admitting: Student

## 2018-06-14 ENCOUNTER — Other Ambulatory Visit: Payer: Self-pay | Admitting: Student

## 2018-06-14 ENCOUNTER — Other Ambulatory Visit: Payer: Self-pay

## 2018-06-14 DIAGNOSIS — Z3A17 17 weeks gestation of pregnancy: Secondary | ICD-10-CM | POA: Insufficient documentation

## 2018-06-14 DIAGNOSIS — J00 Acute nasopharyngitis [common cold]: Secondary | ICD-10-CM | POA: Insufficient documentation

## 2018-06-14 DIAGNOSIS — O99512 Diseases of the respiratory system complicating pregnancy, second trimester: Secondary | ICD-10-CM | POA: Diagnosis not present

## 2018-06-14 DIAGNOSIS — O26892 Other specified pregnancy related conditions, second trimester: Secondary | ICD-10-CM | POA: Diagnosis not present

## 2018-06-14 DIAGNOSIS — H9209 Otalgia, unspecified ear: Secondary | ICD-10-CM | POA: Diagnosis present

## 2018-06-14 LAB — INFLUENZA PANEL BY PCR (TYPE A & B)
INFLBPCR: NEGATIVE
Influenza A By PCR: POSITIVE — AB

## 2018-06-14 LAB — GROUP A STREP BY PCR: GROUP A STREP BY PCR: NOT DETECTED

## 2018-06-14 MED ORDER — OSELTAMIVIR PHOSPHATE 75 MG PO CAPS: 75.0000 mg | ORAL_CAPSULE | Freq: Two times a day (BID) | ORAL | 0 refills | Status: DC

## 2018-06-14 NOTE — MAU Provider Note (Signed)
Patient Betty Gentry 33 y.o. G2P1001 At [redacted]w[redacted]d here with complaints cough, fever, ear pain, and occasional cramping. She denies vaginal bleeding, discharge, dysuria or pelvic pain.  History     CSN: [id number]  Arrival date and time: 06/14/18 1702   First Provider Initiated Contact with Patient 06/14/18 1744      Chief Complaint  Patient presents with  . Fever  . Otalgia  . Abdominal Cramping  . Sore Throat   Fever   This is a new problem. The current episode started today. Associated symptoms include coughing, ear pain and a sore throat.  Otalgia   This is a new (10 days ago) problem. The maximum temperature recorded prior to her arrival was 101 - 101.9 F. Associated symptoms include coughing and a sore throat.  Abdominal Cramping  Associated symptoms include a fever.  Sore Throat   This is a new problem. Associated symptoms include coughing and ear pain. Pertinent negatives include no shortness of breath.  Cough  This is a new problem. The current episode started in the past 7 days. The problem has been unchanged. The cough is non-productive. Associated symptoms include ear pain, a fever, nasal congestion and a sore throat. Pertinent negatives include no chills or shortness of breath.  Patient says that her sore throat doesn't hurt, it just itches. The sore throat has been going on for 7 days.  She was given Augmentin at Urgent Care approximately 7 days ago. 4 days ago she went back to Urgent Care and they told her to stop taking it because she was having GI symptoms and throat itch. Urgent Care doctor was concerned that it could have been an allergic reaction. She then called [facility] and was given a Z pack; she is on Day 2 of that. She came in today because the fever was new.  She had chills "a little bit" when the fever was high, but no body aches. It did not come on quickly.    OB History    Gravida  2   Para  1   Term  1   Preterm      AB      Living   1     SAB      TAB      Ectopic      Multiple  0   Live Births  1           Past Medical History:  Diagnosis Date  . History of chicken pox   . Vaginal delivery 03/18/2017  . Vaginal Pap smear, abnormal     Past Surgical History:  Procedure Laterality Date  . BELPHAROPTOSIS REPAIR    . KNEE SURGERY    . LEEP      Family History  Problem Relation Age of Onset  . Hypertension Father   . High Cholesterol Father   . Heart attack Paternal Grandmother   . Lung cancer Paternal Grandmother   . Asthma Mother   . Arthritis Maternal Grandmother   . Heart disease Maternal Grandmother   . Heart attack Maternal Grandmother     Social History   Tobacco Use  . Smoking status: Never Smoker  . Smokeless tobacco: Never Used  Substance Use Topics  . Alcohol use: No    Frequency: Never  . Drug use: No    Allergies:  Allergies  Allergen Reactions  . Erythromycin Nausea Only  . Sulfa Antibiotics Nausea Only  . Tetracyclines & Related Nausea Only  No medications prior to admission.    Review of Systems  Constitutional: Positive for fever. Negative for chills.  HENT: Positive for ear pain and sore throat.   Respiratory: Positive for cough. Negative for shortness of breath.   Gastrointestinal: Negative.   Genitourinary: Negative.   Neurological: Negative.   Psychiatric/Behavioral: Negative.    Physical Exam   Blood pressure 118/63, pulse (!) 109, temperature 98.8 F (37.1 C), resp. rate 18, last menstrual period 02/15/2018, SpO2 100 %, not currently breastfeeding.  Physical Exam  Constitutional: She is oriented to person, place, and time. She appears well-developed and well-nourished.  HENT:  Head: Normocephalic.  Mouth/Throat: No oropharyngeal exudate.  Bilateral TM are pink and bulging; no tragal tenderness. Oropharynx is moist; uvula is slightly reddended but no exudate or enlarged tonsils.   Cardiovascular: Normal rate and regular rhythm.   Respiratory: Effort normal and breath sounds normal. No respiratory distress. She has no wheezes. She has no rales. She exhibits no tenderness.  GI: Soft.  Musculoskeletal: Normal range of motion.  Neurological: She is alert and oriented to person, place, and time.  Skin: Skin is warm and dry.    MAU Course  Procedures  MDM -flu and strep testing pending -Patient is not sure she wants to take Tamiflu; she would prefer to wait until she gets the results back for strep and flu.  -FHR is 144  Assessment and Plan   1. Acute nasopharyngitis    2. Patient given list of safe medications in pregnancy.   3. Flu and strep pending; patient will make a decision based on the results of those swabs.   4. Patient and partner stable for discharge with return precautions; message routed to [facility].   Charlesetta Garibaldi Olufemi Mofield 06/14/2018, 6:39 PM

## 2018-06-14 NOTE — Discharge Instructions (Signed)
Cough, Adult  A cough helps to clear your throat and lungs. A cough may last only 2-3 weeks (acute), or it may last longer than 8 weeks (chronic). Many different things can cause a cough. A cough may be a sign of an illness or another medical condition. Follow these instructions at home:  Pay attention to any changes in your cough.  Take medicines only as told by your doctor. ? If you were prescribed an antibiotic medicine, take it as told by your doctor. Do not stop taking it even if you start to feel better. ? Talk with your doctor before you try using a cough medicine.  Drink enough fluid to keep your pee (urine) clear or pale yellow.  If the air is dry, use a cold steam vaporizer or humidifier in your home.  Stay away from things that make you cough at work or at home.  If your cough is worse at night, try using extra pillows to raise your head up higher while you sleep.  Do not smoke, and try not to be around smoke. If you need help quitting, ask your doctor.  Do not have caffeine.  Do not drink alcohol.  Rest as needed. Contact a doctor if:  You have new problems (symptoms).  You cough up yellow fluid (pus).  Your cough does not get better after 2-3 weeks, or your cough gets worse.  Medicine does not help your cough and you are not sleeping well.  You have pain that gets worse or pain that is not helped with medicine.  You have a fever.  You are losing weight and you do not know why.  You have night sweats. Get help right away if:  You cough up blood.  You have trouble breathing.  Your heartbeat is very fast. This information is not intended to replace advice given to you by your health care provider. Make sure you discuss any questions you have with your health care provider. Document Released: 12/20/2010 Document Revised: 09/14/2015 Document Reviewed: 06/15/2014 Elsevier Interactive Patient Education  2019 [employer].   How to Perform a Sinus Rinse A  sinus rinse is a home treatment. It rinses your sinuses with a mixture of salt and water (saline solution). Sinuses are air-filled spaces in your skull behind the bones of your face and forehead. They open into your nasal cavity. A sinus rinse can help to clear your nasal cavity. It can clear mucus, dirt, dust, or pollen. You may do a sinus rinse when you have:  A cold.  A virus.  Allergies.  A sinus infection.  A stuffy nose. Talk with your doctor about whether a sinus rinse might help you. What are the risks? A sinus rinse is normally very safe and helpful. However, there are a few risks. These include:  A burning feeling in the sinuses. This may happen if you do not make the saline solution as instructed. Be sure to follow all directions when making the saline solution.  Nasal irritation.  Infection from unclean water. This is rare, but possible. Do not do a sinus rinse if you have had:  Ear or nasal surgery.  An ear infection.  Blocked ears. Supplies needed:  Saline solution or powder.  Distilled or germ-free (sterile) water may be needed to mix with saline powder. ? You may use boiled and cooled tap water. Boil tap water for 5 minutes; cool until it is lukewarm. Use within 24 hours. ? Do not use regular tap water  to mix with the saline solution.  Neti pot or nasal rinse bottle. This releases the saline solution into your nose and through your sinuses. You can buy neti pots and rinse bottles: ? At your local pharmacy. ? At a health food store. ? Online. How to perform a sinus rinse  1. Wash your hands with soap and water. 2. Wash your device using the directions that came with it. 3. Dry your device. 4. Use the solution that comes with your device or one that is sold separately in stores. Follow the mixing directions on the package if you need to mix with sterile or distilled water. 5. Fill your device with the amount of saline solution stated in the device  instructions. 6. Stand over a sink and tilt your head sideways over the sink. 7. Place the spout of the device in your upper nostril (the one closer to the ceiling). 8. Gently pour or squeeze the saline solution into your nasal cavity. The liquid should drain to your lower nostril if you are not too stuffed up (congested). 9. While rinsing, breathe through your open mouth. 10. Gently blow your nose to clear any mucus and rinse solution. Blowing too hard may cause ear pain. 11. Repeat in your other nostril. 12. Clean and rinse your device with clean water. 13. Air-dry your device. Talk with your doctor or pharmacist if you have questions about how to do a sinus rinse. Summary  A sinus rinse is a home treatment. It rinses your sinuses with a mixture of salt and water (saline solution).  A sinus rinse is normally very safe and helpful. Follow all instructions carefully.  Talk with your doctor about whether a sinus rinse might help you. This information is not intended to replace advice given to you by your health care provider. Make sure you discuss any questions you have with your health care provider. Document Released: 11/03/2013 Document Revised: 02/03/2017 Document Reviewed: 02/03/2017 Elsevier Interactive Patient Education  2019 [employer].  [employer] Vaporizer A cool mist vaporizer is a device that releases a cool mist into the air. If you have a cough or a cold, using a vaporizer may help relieve your symptoms. The mist adds moisture to the air, which may help thin your mucus and make it less sticky. When your mucus is thin and less sticky, it easier for you to breathe and to cough up secretions. Do not use a vaporizer if you are allergic to mold. Follow these instructions at home:  Follow the instructions that come with the vaporizer.  Do not use anything other than distilled water in the vaporizer.  Do not run the vaporizer all of the time. Doing that can cause mold or  bacteria to grow in the vaporizer.  Clean the vaporizer after each time that you use it.  Clean and dry the vaporizer well before storing it.  Stop using the vaporizer if your breathing symptoms get worse. This information is not intended to replace advice given to you by your health care provider. Make sure you discuss any questions you have with your health care provider. Document Released: 01/04/2004 Document Revised: 10/27/2015 Document Reviewed: 07/08/2015 Elsevier Interactive Patient Education  2019 [employer].

## 2018-06-14 NOTE — Telephone Encounter (Signed)
Called patient and let her know that she tested positive for Influenza A; she will pick up her Tamiflu tomorrow. Reviewed when she can return to work; encouraged rest, hydration. Reviewed return precautions.  Betty Gentry

## 2018-06-14 NOTE — MAU Note (Signed)
Pt presents to MAU with complaints of cough, fever earache and lower abdominal cramping. Fever started today the rest of her symptoms started a week ago.

## 2018-06-25 DIAGNOSIS — N760 Acute vaginitis: Secondary | ICD-10-CM | POA: Diagnosis not present

## 2018-06-25 DIAGNOSIS — Z3A19 19 weeks gestation of pregnancy: Secondary | ICD-10-CM | POA: Diagnosis not present

## 2018-06-25 DIAGNOSIS — D649 Anemia, unspecified: Secondary | ICD-10-CM | POA: Diagnosis not present

## 2018-06-25 DIAGNOSIS — Z363 Encounter for antenatal screening for malformations: Secondary | ICD-10-CM | POA: Diagnosis not present

## 2018-06-25 DIAGNOSIS — Z3400 Encounter for supervision of normal first pregnancy, unspecified trimester: Secondary | ICD-10-CM | POA: Diagnosis not present

## 2018-06-25 DIAGNOSIS — Z3A18 18 weeks gestation of pregnancy: Secondary | ICD-10-CM | POA: Diagnosis not present

## 2018-08-17 ENCOUNTER — Other Ambulatory Visit: Payer: Self-pay

## 2018-08-17 ENCOUNTER — Encounter (HOSPITAL_COMMUNITY): Payer: Self-pay | Admitting: *Deleted

## 2018-08-17 ENCOUNTER — Inpatient Hospital Stay (HOSPITAL_COMMUNITY)
Admission: AD | Admit: 2018-08-17 | Discharge: 2018-08-17 | Disposition: A | Discharge status: Home / Self Care | Payer: 59 | Attending: Obstetrics and Gynecology | Admitting: Obstetrics and Gynecology

## 2018-08-17 DIAGNOSIS — O4703 False labor before 37 completed weeks of gestation, third trimester: Secondary | ICD-10-CM | POA: Diagnosis present

## 2018-08-17 DIAGNOSIS — Z3A26 26 weeks gestation of pregnancy: Secondary | ICD-10-CM | POA: Insufficient documentation

## 2018-08-17 DIAGNOSIS — M25559 Pain in unspecified hip: Secondary | ICD-10-CM | POA: Diagnosis not present

## 2018-08-17 DIAGNOSIS — Z3689 Encounter for other specified antenatal screening: Secondary | ICD-10-CM | POA: Diagnosis not present

## 2018-08-17 DIAGNOSIS — O26892 Other specified pregnancy related conditions, second trimester: Secondary | ICD-10-CM | POA: Diagnosis not present

## 2018-08-17 DIAGNOSIS — O4702 False labor before 37 completed weeks of gestation, second trimester: Secondary | ICD-10-CM | POA: Diagnosis not present

## 2018-08-17 LAB — URINALYSIS, ROUTINE W REFLEX MICROSCOPIC
Bilirubin Urine: NEGATIVE
Glucose, UA: NEGATIVE mg/dL
Hgb urine dipstick: NEGATIVE
Ketones, ur: NEGATIVE mg/dL
Leukocytes,Ua: NEGATIVE
Nitrite: NEGATIVE
Protein, ur: NEGATIVE mg/dL
Specific Gravity, Urine: 1.004 — ABNORMAL LOW (ref 1.005–1.030)
pH: 7 (ref 5.0–8.0)

## 2018-08-17 LAB — FETAL FIBRONECTIN: Fetal Fibronectin: NEGATIVE

## 2018-08-17 MED ORDER — NIFEDIPINE 10 MG PO CAPS
10.0000 mg | ORAL_CAPSULE | ORAL | Status: AC | PRN
  Administered 2018-08-17 (×4): 10 mg via ORAL
  Filled 2018-08-17 (×4): qty 1

## 2018-08-17 NOTE — MAU Note (Signed)
Presents with c/o ctxs, hip pain, and pressure that began Saturday.  Denies VB or LOF.  Reports +FM.

## 2018-08-17 NOTE — MAU Provider Note (Signed)
History     CSN: [id number]  Arrival date and time: 08/17/18 1605   First Provider Initiated Contact with Patient 08/17/18 1648      Chief Complaint  Patient presents with  . Contractions  . Pressure  . Hip Pain   Betty Gentry is a 33 y.o. G2P1 at [redacted]w[redacted]d who presents to MAU with complaints of contractions, pressure and pelvic pain. She reports Braxton Hicks contractions started occurring Saturday. Reports contractions were intermittent on Saturday then went away Sunday. Sunday started having pelvic pain and pressure, "like baby was sitting right on top of my hips and vagina". Then today when she went back to work starting having contractions 30 minutes into shift around noon today. Describes as braxton hicks contractions. Denies recent IC, denies vaginal bleeding or discharge. + FM. Reports drinking 2 glasses of water today.    OB History    Gravida  2   Para  1   Term  1   Preterm      AB      Living  1     SAB      TAB      Ectopic      Multiple  0   Live Births  1           Past Medical History:  Diagnosis Date  . History of chicken pox   . Vaginal delivery 03/18/2017  . Vaginal Pap smear, abnormal     Past Surgical History:  Procedure Laterality Date  . BELPHAROPTOSIS REPAIR    . KNEE SURGERY    . LEEP      Family History  Problem Relation Age of Onset  . Hypertension Father   . High Cholesterol Father   . Heart attack Paternal Grandmother   . Lung cancer Paternal Grandmother   . Asthma Mother   . Arthritis Maternal Grandmother   . Heart disease Maternal Grandmother   . Heart attack Maternal Grandmother     Social History   Tobacco Use  . Smoking status: Never Smoker  . Smokeless tobacco: Never Used  Substance Use Topics  . Alcohol use: No    Frequency: Never  . Drug use: No    Allergies:  Allergies  Allergen Reactions  . Erythromycin Nausea Only  . Sulfa Antibiotics Nausea Only  . Tetracyclines & Related Nausea Only     Medications Prior to Admission  Medication Sig Dispense Refill Last Dose  . Prenatal Vit-Fe Fumarate-FA (PRENATAL MULTIVITAMIN) TABS tablet Take 1 tablet by mouth daily at 12 noon.   08/17/2018 at 1000  . oseltamivir (TAMIFLU) 75 MG capsule Take 1 capsule (75 mg total) by mouth 2 (two) times daily. 10 capsule 0   . promethazine-dextromethorphan (PROMETHAZINE-DM) 6.25-15 MG/5ML syrup Take 2.5-5 mLs by mouth 4 (four) times daily as needed for cough. At night 118 mL 0     Review of Systems  Constitutional: Negative.   Respiratory: Negative.   Cardiovascular: Negative.   Gastrointestinal: Positive for abdominal pain. Negative for constipation, diarrhea, nausea and vomiting.  Genitourinary: Positive for pelvic pain. Negative for difficulty urinating, dysuria, frequency, hematuria, vaginal bleeding and vaginal discharge.       Pelvic pressure  Musculoskeletal: Negative.    Physical Exam   Blood pressure (!) 109/59, pulse 88, temperature 97.8 F (36.6 C), temperature source Oral, resp. rate 20, height 5\' 2"  (1.575 m), weight 61.4 kg, last menstrual period 02/15/2018, SpO2 99 %, not currently breastfeeding.  Physical Exam  Nursing note  and vitals reviewed. Constitutional: She is oriented to person, place, and time. She appears well-developed and well-nourished. No distress.  Cardiovascular: Normal rate, regular rhythm and normal heart sounds.  Respiratory: Effort normal and breath sounds normal. No respiratory distress. She has no wheezes. She has no rales.  GI: Soft. There is no abdominal tenderness. There is no rebound and no guarding.  Gravid appropriate for gestational age, mild contractions palpated   Musculoskeletal: Normal range of motion.        General: No edema.  Neurological: She is alert and oriented to person, place, and time.  Psychiatric: She has a normal mood and affect. Her behavior is normal. Thought content normal.   FFN collected prior to cervical examination   Dilation: Fingertip Effacement (%): Thick Cervical Position: Middle Exam by:: Lanice ShirtsV. Laren Orama, CNM  FHR: 140/moderate/+accels/ no decelerations  Toco: occasional mild contractions   MAU Course  Procedures  MDM Orders Placed This Encounter  Procedures  . Urinalysis, Routine w reflex microscopic  . Fetal fibronectin  . Encourage/Reinforce Importance Po Fluids   FFN collected and sent  FFN negative  UA negative  2 pitchers of water PO  Procardia x4 given in MAU   Upon reassessment patient reports feeling much better and is no longer complaining of contractions, pelvic pain or pressure.   NST reactive  Cervix recheck unchanged.  Discussed reasons to return to MAU. Follow up as scheduled for prenatal appointments. Increase amount of water to 6-8 bottle per day. Pt stable at time of discharge.   Assessment and Plan   1. Preterm uterine contractions in second trimester, antepartum   2. [redacted] weeks gestation of pregnancy   3. NST (non-stress test) reactive    Discharge home Follow up as scheduled in the office  Return to MAU as needed Hydration  Fetal kick counts starting @ 28 weeks   Follow-up Information    [place] Follow up.   Why:  Follow up as scheduled for prenatal appointments  Contact information: [address] [place] [address] [phone]          Allergies as of 08/17/2018      Reactions   Erythromycin Nausea Only   Sulfa Antibiotics Nausea Only   Tetracyclines & Related Nausea Only      Medication List    STOP taking these medications   oseltamivir 75 MG capsule Commonly known as:  TAMIFLU   promethazine-dextromethorphan 6.25-15 MG/5ML syrup Commonly known as:  PROMETHAZINE-DM     TAKE these medications   prenatal multivitamin Tabs tablet Take 1 tablet by mouth daily at 12 noon.      Sharyon CableVeronica C Anielle Headrick CNM 08/17/2018, 7:20 PM

## 2018-08-27 DIAGNOSIS — Z3400 Encounter for supervision of normal first pregnancy, unspecified trimester: Secondary | ICD-10-CM | POA: Diagnosis not present

## 2018-08-27 DIAGNOSIS — Z23 Encounter for immunization: Secondary | ICD-10-CM | POA: Diagnosis not present

## 2018-08-27 DIAGNOSIS — Z3A27 27 weeks gestation of pregnancy: Secondary | ICD-10-CM | POA: Diagnosis not present

## 2018-08-27 DIAGNOSIS — O360920 Maternal care for other rhesus isoimmunization, second trimester, not applicable or unspecified: Secondary | ICD-10-CM | POA: Diagnosis not present

## 2018-10-12 ENCOUNTER — Observation Stay (HOSPITAL_COMMUNITY)
Admission: AD | Admit: 2018-10-12 | Discharge: 2018-10-13 | Disposition: A | Discharge status: Home / Self Care | Payer: No Typology Code available for payment source | Attending: Obstetrics and Gynecology | Admitting: Obstetrics and Gynecology

## 2018-10-12 ENCOUNTER — Other Ambulatory Visit: Payer: Self-pay

## 2018-10-12 ENCOUNTER — Encounter (HOSPITAL_COMMUNITY): Payer: Self-pay

## 2018-10-12 DIAGNOSIS — Z3A34 34 weeks gestation of pregnancy: Secondary | ICD-10-CM | POA: Diagnosis not present

## 2018-10-12 DIAGNOSIS — Z1159 Encounter for screening for other viral diseases: Secondary | ICD-10-CM | POA: Insufficient documentation

## 2018-10-12 DIAGNOSIS — Z3493 Encounter for supervision of normal pregnancy, unspecified, third trimester: Secondary | ICD-10-CM

## 2018-10-12 LAB — URINALYSIS, ROUTINE W REFLEX MICROSCOPIC
Bilirubin Urine: NEGATIVE
Glucose, UA: NEGATIVE mg/dL
Hgb urine dipstick: NEGATIVE
Ketones, ur: NEGATIVE mg/dL
Leukocytes,Ua: NEGATIVE
Nitrite: NEGATIVE
Protein, ur: NEGATIVE mg/dL
Specific Gravity, Urine: 1.01 (ref 1.005–1.030)
pH: 6 (ref 5.0–8.0)

## 2018-10-12 LAB — SARS CORONAVIRUS 2 BY RT PCR (HOSPITAL ORDER, PERFORMED IN ~~LOC~~ HOSPITAL LAB): SARS Coronavirus 2: NEGATIVE

## 2018-10-12 LAB — CBC
HCT: 36.9 % (ref 36.0–46.0)
Hemoglobin: 12.6 g/dL (ref 12.0–15.0)
MCH: 31.3 pg (ref 26.0–34.0)
MCHC: 34.1 g/dL (ref 30.0–36.0)
MCV: 91.6 fL (ref 80.0–100.0)
Platelets: 187 10*3/uL (ref 150–400)
RBC: 4.03 MIL/uL (ref 3.87–5.11)
RDW: 13.4 % (ref 11.5–15.5)
WBC: 16.2 10*3/uL — ABNORMAL HIGH (ref 4.0–10.5)
nRBC: 0 % (ref 0.0–0.2)

## 2018-10-12 LAB — OB RESULTS CONSOLE GBS: GBS: NEGATIVE

## 2018-10-12 MED ORDER — NIFEDIPINE 10 MG PO CAPS
10.0000 mg | ORAL_CAPSULE | ORAL | Status: AC
  Administered 2018-10-12 (×2): 10 mg via ORAL
  Filled 2018-10-12 (×2): qty 1

## 2018-10-12 MED ORDER — MAGNESIUM SULFATE 40 G IN LACTATED RINGERS - SIMPLE
2.0000 g/h | INTRAVENOUS | Status: AC
  Filled 2018-10-12: qty 500

## 2018-10-12 MED ORDER — LACTATED RINGERS IV SOLN
INTRAVENOUS | Status: DC
  Administered 2018-10-12 – 2018-10-13 (×2): via INTRAVENOUS

## 2018-10-12 MED ORDER — ACETAMINOPHEN 325 MG PO TABS
650.0000 mg | ORAL_TABLET | ORAL | Status: DC | PRN
  Administered 2018-10-12: 650 mg via ORAL
  Filled 2018-10-12: qty 2

## 2018-10-12 MED ORDER — CALCIUM CARBONATE ANTACID 500 MG PO CHEW: 2.0000 | CHEWABLE_TABLET | ORAL | Status: DC | PRN

## 2018-10-12 MED ORDER — DOCUSATE SODIUM 100 MG PO CAPS
100.0000 mg | ORAL_CAPSULE | Freq: Every day | ORAL | Status: DC
  Administered 2018-10-13: 100 mg via ORAL
  Filled 2018-10-12: qty 1

## 2018-10-12 MED ORDER — ZOLPIDEM TARTRATE 5 MG PO TABS: 5.0000 mg | ORAL_TABLET | Freq: Every evening | ORAL | Status: DC | PRN

## 2018-10-12 MED ORDER — BETAMETHASONE SOD PHOS & ACET 6 (3-3) MG/ML IJ SUSP
12.0000 mg | INTRAMUSCULAR | Status: AC
  Administered 2018-10-12 – 2018-10-13 (×2): 12 mg via INTRAMUSCULAR
  Filled 2018-10-12 (×2): qty 2

## 2018-10-12 MED ORDER — PRENATAL MULTIVITAMIN CH
1.0000 | ORAL_TABLET | Freq: Every day | ORAL | Status: DC
  Administered 2018-10-13: 1 via ORAL
  Filled 2018-10-12: qty 1

## 2018-10-12 MED ORDER — MAGNESIUM SULFATE BOLUS VIA INFUSION
4.0000 g | Freq: Once | INTRAVENOUS | Status: AC
  Administered 2018-10-12: 4 g via INTRAVENOUS
  Filled 2018-10-12: qty 500

## 2018-10-12 NOTE — MAU Provider Note (Signed)
History     CSN: [id number]  Arrival date and time: 10/12/18 1540   First Provider Initiated Contact with Patient 10/12/18 1609      Chief Complaint  Patient presents with  . Contractions   HPI  Ms.  Betty Gentry is a 33 y.o. year old 722P1001 female at 751w1d weeks gestation who presents to MAU reporting she was seen at [facility] for evaluation of PTL. She reports that she was put on Procardia 10 mg every 6 hrs for UC's Friday 10/09/2018. She called the office today to report that she was having UCs every 20-30 mins and they "got worse with standing." She went to work for about 1 hour, closer together, but not stronger. She reports that she went to the office after that and the UCs were about every 15 mins. She was told she is dilated 1 cm. She said Dr. Renaldo FiddlerAdkins stated she "might get started magnesium and admitted.   Past Medical History:  Diagnosis Date  . History of chicken pox   . Vaginal delivery 03/18/2017  . Vaginal Pap smear, abnormal     Past Surgical History:  Procedure Laterality Date  . BELPHAROPTOSIS REPAIR    . KNEE SURGERY    . LEEP      Family History  Problem Relation Age of Onset  . Hypertension Father   . High Cholesterol Father   . Heart attack Paternal Grandmother   . Lung cancer Paternal Grandmother   . Asthma Mother   . Arthritis Maternal Grandmother   . Heart disease Maternal Grandmother   . Heart attack Maternal Grandmother     Social History   Tobacco Use  . Smoking status: Never Smoker  . Smokeless tobacco: Never Used  Substance Use Topics  . Alcohol use: No    Frequency: Never  . Drug use: No    Allergies:  Allergies  Allergen Reactions  . Erythromycin Nausea Only  . Sulfa Antibiotics Nausea Only  . Tetracyclines & Related Nausea Only    Medications Prior to Admission  Medication Sig Dispense Refill Last Dose  . Prenatal Vit-Fe Fumarate-FA (PRENATAL MULTIVITAMIN) TABS tablet Take 1 tablet by mouth daily at 12  noon.       Review of Systems  Constitutional: Negative.   HENT: Negative.   Eyes: Negative.   Respiratory: Negative.   Cardiovascular: Negative.   Gastrointestinal: Negative.   Endocrine: Negative.   Genitourinary: Positive for pelvic pain (UC's every 15 mins).  Musculoskeletal: Negative.   Skin: Negative.   Allergic/Immunologic: Negative.   Neurological: Negative.   Hematological: Negative.   Psychiatric/Behavioral: Negative.    Physical Exam   Blood pressure 115/70, pulse 89, temperature 98 F (36.7 C), temperature source Oral, resp. rate 18, height 5\' 2"  (1.575 m), weight 64.6 kg, last menstrual period 02/15/2018, SpO2 100 %.  Physical Exam  Nursing note and vitals reviewed. Constitutional: She is oriented to person, place, and time. She appears well-developed and well-nourished.  HENT:  Head: Normocephalic and atraumatic.  Eyes: Pupils are equal, round, and reactive to light.  Cardiovascular: Normal rate and regular rhythm.  Respiratory: Effort normal.  GI: Soft.  Genitourinary:    Genitourinary Comments: Dilation: 1 Effacement (%): Thick Cervical Position: Posterior Station: Ballotable Presentation: Vertex Exam by: Carloyn Jaeger Lester Platas CNM    Musculoskeletal: Normal range of motion.  Neurological: She is alert and oriented to person, place, and time.  Skin: Skin is warm and dry.  Psychiatric: She has a normal mood and  affect. Her behavior is normal. Judgment and thought content normal.  Reassessed cervical dilation @ 1831: unchanged  NST - FHR: 135 bpm / moderate variability / accels present / decels absent / TOCO: 3 UCs with UI Noted @ 1715   MAU Course  Procedures  MDM CCUA Procardia 10 mg every 20 mins x 3 doses -- UC's spaced out and more UI Noted after 2nd dose  UC's increased in frequency   Results for orders placed or performed during the hospital encounter of 10/12/18 (from the past 24 hour(s))  Urinalysis, Routine w reflex microscopic     Status: None    Collection Time: 10/12/18  4:41 PM  Result Value Ref Range   Color, Urine YELLOW YELLOW   APPearance CLEAR CLEAR   Specific Gravity, Urine 1.010 1.005 - 1.030   pH 6.0 5.0 - 8.0   Glucose, UA NEGATIVE NEGATIVE mg/dL   Hgb urine dipstick NEGATIVE NEGATIVE   Bilirubin Urine NEGATIVE NEGATIVE   Ketones, ur NEGATIVE NEGATIVE mg/dL   Protein, ur NEGATIVE NEGATIVE mg/dL   Nitrite NEGATIVE NEGATIVE   Leukocytes,Ua NEGATIVE NEGATIVE    Assessment and Plan  Preterm Labor - Admission orders entered by Dr. Julien Girt - Care assumed by Dr. Julien Girt at [address], MSN, [place] 10/12/2018, 6:50 PM

## 2018-10-12 NOTE — Progress Notes (Signed)
Pt states she was started on Procardia 10mg  on Friday & last took med at 1230.

## 2018-10-12 NOTE — MAU Note (Signed)
Pt sent from MD office for ctxs.  Pt reports MD states she's dilated to 1cm.  Denies VB or LOF.  Reports +FM.

## 2018-10-12 NOTE — Progress Notes (Signed)
CRITICAL VALUE STICKER  CRITICAL VALUE: Antibody Positive  RECEIVER (on-site recipient of call): Shirlee Latch  DATE & TIME NOTIFIED: 6/22 2000  RESPONSE: Discussed Rhogam with patient. Patient reported receiving Rhogam in office at 27 weeks.

## 2018-10-12 NOTE — Plan of Care (Signed)
  Problem: Education: Goal: Knowledge of General Education information will improve Description: Including pain rating scale, medication(s)/side effects and non-pharmacologic comfort measures Outcome: Progressing   Problem: Health Behavior/Discharge Planning: Goal: Ability to manage health-related needs will improve Outcome: Progressing   Problem: Clinical Measurements: Goal: Ability to maintain clinical measurements within normal limits will improve Outcome: Progressing   Problem: Clinical Measurements: Goal: Ability to maintain clinical measurements within normal limits will improve Outcome: Progressing   Problem: Education: Goal: Knowledge of disease or condition will improve Outcome: Progressing   Problem: Education: Goal: Knowledge of the prescribed therapeutic regimen will improve Outcome: Progressing

## 2018-10-12 NOTE — Progress Notes (Signed)
D [place] charge RN [place] called & report given that pt needs Covid lab drawn.

## 2018-10-12 NOTE — H&P (Signed)
Betty Gentry is a 33 y.o. G2P1 @ 34+1 weeks female presenting for PTL.  Pt began having contractions last week.  She was started on procardia last week and her contractions continue.  Noticed an increase in intensity this morning.  No vb or LOF.  + FM OB History    Gravida  2   Para  1   Term  1   Preterm      AB      Living  1     SAB      TAB      Ectopic      Multiple  0   Live Births  1        Obstetric Comments  3rd degree tear/episitomy       Past Medical History:  Diagnosis Date  . History of chicken pox   . Vaginal delivery 03/18/2017  . Vaginal Pap smear, abnormal    Past Surgical History:  Procedure Laterality Date  . [place]    . LEEP     Family History: family history includes Arthritis in her maternal grandmother; Asthma in her mother; Heart attack in her maternal grandmother and paternal grandmother; Heart disease in her maternal grandmother; High Cholesterol in her father; Hypertension in her father; Lung cancer in her paternal grandmother. Social History:  reports that she has never smoked. She has never used smokeless tobacco. She reports that she does not drink alcohol or use drugs.     Maternal Diabetes: No Genetic Screening: Declined Maternal Ultrasounds/Referrals: Normal Fetal Ultrasounds or other Referrals:  None Maternal Substance Abuse:  No Significant Maternal Medications:  None Significant Maternal Lab Results:  None Other Comments:  None  ROS History Dilation: 1 Effacement (%): Thick Station: Ballotable Exam by:: R Dawson CNM Blood pressure 115/70, pulse 89, temperature 98 F (36.7 C), temperature source Oral, resp. rate 18, height 5\' 2"  (1.575 m), weight 64.6 kg, last menstrual period 02/15/2018, SpO2 100 %, not currently breastfeeding. Exam Physical Exam  Gen - NAD Abd - gravid, NT Ext - NT, no edema PV - cervix 1/50/-2, vtx  Prenatal labs: ABO, Rh:   Antibody:   Rubella:    RPR:    HBsAg:    HIV:    GBS:     Assessment/Plan: PTL Admit for observation and BMZ series No improvement w/ procardia - will start magnesium   Betty Gentry 10/12/2018, 6:25 PM

## 2018-10-13 DIAGNOSIS — Z3493 Encounter for supervision of normal pregnancy, unspecified, third trimester: Secondary | ICD-10-CM

## 2018-10-13 NOTE — Progress Notes (Signed)
S: Feels much improved - Contractions and cramping resolved as soon as Mag was started. Would like to go home later today. No LOF or VB. +FM.   O: AFVSS CV reg rate Pulm NWOB Abd soft, nontender, nondistended SVE deferred given no ctxn on toco and no c/o contractions  A/P: 32yo G2P1001 @ 34.2 wga admitted for PTL for BMZ series. Was on outpatient procardia that did not help contractions. Placed on Magnesium with resolution of contractions. Mag shut off ~ 2 hours ago and pt continues to feel comf  BMZ #2 due at 1700 Anticipate d/c after that    Lucillie Garfinkel MD

## 2018-10-13 NOTE — Plan of Care (Signed)
Pt. To D/C 2nd BMZ given instructions reviewed, questions answered.

## 2018-10-13 NOTE — Plan of Care (Signed)
Pt. Post mag. Will d/c this afternoon post 2nd dose BMZ.

## 2018-10-13 NOTE — Discharge Summary (Signed)
    OB Discharge Summary     Patient Name: Betty Gentry DOB: 1986/03/26 MRN: [id number]  Date of admission: 10/12/2018 Delivering MD: This patient has no babies on file.  Date of discharge: 10/13/2018  Admitting diagnosis: CTX Intrauterine pregnancy: [redacted]w[redacted]d     Secondary diagnosis:  Active Problems:   Preterm labor   Pregnant and not yet delivered in third trimester  Discharge diagnosis: preterm labor                                     Hospital course:  A/P: 33yo G2P1001 @ 34.2 wga admitted for PTL for BMZ series. Was on outpatient procardia that did not help contractions. Placed on Magnesium with resolution of contractions. Magnesium was discontinued and patient w/out regular contractions for >10 hours after Mag off. BMZ x 2 was given.   Physical exam  Vitals:   10/13/18 0700 10/13/18 0820 10/13/18 1205 10/13/18 1615  BP:  108/67 112/66 110/68  Pulse:  88 91 93  Resp: 15 16 17 17   Temp:  98.1 F (36.7 C) 98.3 F (36.8 C) 98.7 F (37.1 C)  TempSrc:  Oral Oral Oral  SpO2:  100% 98% 99%  Weight:      Height:       AFVSS CV reg rate Pulm NWOB Abd soft, nontender, nondistended SVE deferred given no ctxn on toco and no c/o contractions   Lab Results  Component Value Date   WBC 16.2 (H) 10/12/2018   HGB 12.6 10/12/2018   HCT 36.9 10/12/2018   MCV 91.6 10/12/2018   PLT 187 10/12/2018   CMP Latest Ref Rng & Units 10/17/2017  Glucose 70 - 99 mg/dL 90  BUN 6 - 23 mg/dL 10  Creatinine 0.40 - 1.20 mg/dL 0.78  Sodium 135 - 145 mEq/L 139  Potassium 3.5 - 5.1 mEq/L 4.3  Chloride 96 - 112 mEq/L 105  CO2 19 - 32 mEq/L 26  Calcium 8.4 - 10.5 mg/dL 9.2  Total Protein 6.0 - 8.3 g/dL 6.6  Total Bilirubin 0.2 - 1.2 mg/dL 0.5  Alkaline Phos 39 - 117 U/L 69  AST 0 - 37 U/L 17  ALT 0 - 35 U/L 11    Discharge instruction: per After Visit Summary  After visit meds:  Allergies as of 10/13/2018      Reactions   Erythromycin Nausea Only   Sulfa Antibiotics Nausea  Only   Tetracyclines & Related Nausea Only      Medication List    TAKE these medications   prenatal multivitamin Tabs tablet Take 1 tablet by mouth daily at 12 noon.       Diet: routine diet  Activity: Advance as tolerated. Pelvic rest for 6 weeks.   Outpatient follow [facility] 1 week Follow up Appt:No future appointments. Follow up Visit:No follow-ups on file.  10/13/2018 Tyson Dense, MD

## 2018-10-13 NOTE — Progress Notes (Signed)
Call to Dr. Royston Sinner re: FHR monitoring and tocometry. Will d/c orders for continuous. POC to d/c this evening. No new orders for monitoring at this time, Dr. Royston Sinner says pt. Will discharge and since FHR looks reassuring no need to monitor again this afternoon. Pt. Educated to call for changes and can be placed on the monitor as appropriate.

## 2018-10-14 LAB — CULTURE, BETA STREP (GROUP B ONLY)

## 2018-10-14 LAB — BPAM RBC
Blood Product Expiration Date: 202007132359
Blood Product Expiration Date: 202007142359
Unit Type and Rh: 600
Unit Type and Rh: 600

## 2018-10-14 LAB — TYPE AND SCREEN
ABO/RH(D): A NEG
Antibody Screen: POSITIVE
Unit division: 0
Unit division: 0

## 2018-11-03 DIAGNOSIS — Z3A37 37 weeks gestation of pregnancy: Secondary | ICD-10-CM | POA: Diagnosis not present

## 2018-11-03 DIAGNOSIS — O26893 Other specified pregnancy related conditions, third trimester: Secondary | ICD-10-CM | POA: Diagnosis not present

## 2018-11-04 ENCOUNTER — Telehealth (HOSPITAL_COMMUNITY): Payer: Self-pay | Admitting: *Deleted

## 2018-11-04 ENCOUNTER — Encounter (HOSPITAL_COMMUNITY): Payer: Self-pay | Admitting: *Deleted

## 2018-11-04 NOTE — Telephone Encounter (Signed)
Preadmission screen  

## 2018-11-05 ENCOUNTER — Telehealth (HOSPITAL_COMMUNITY): Payer: Self-pay | Admitting: *Deleted

## 2018-11-05 ENCOUNTER — Encounter (HOSPITAL_COMMUNITY): Payer: Self-pay | Admitting: *Deleted

## 2018-11-05 NOTE — Telephone Encounter (Signed)
Preadmission screen  

## 2018-11-12 ENCOUNTER — Encounter (HOSPITAL_COMMUNITY): Payer: Self-pay

## 2018-11-12 ENCOUNTER — Inpatient Hospital Stay (HOSPITAL_COMMUNITY)
Admission: AD | Admit: 2018-11-12 | Discharge: 2018-11-13 | Disposition: A | Discharge status: Home / Self Care | Payer: 59 | Attending: Obstetrics and Gynecology | Admitting: Obstetrics and Gynecology

## 2018-11-12 ENCOUNTER — Other Ambulatory Visit: Payer: Self-pay

## 2018-11-12 DIAGNOSIS — O479 False labor, unspecified: Secondary | ICD-10-CM

## 2018-11-12 DIAGNOSIS — O471 False labor at or after 37 completed weeks of gestation: Principal | ICD-10-CM | POA: Insufficient documentation

## 2018-11-12 DIAGNOSIS — Z1159 Encounter for screening for other viral diseases: Secondary | ICD-10-CM | POA: Insufficient documentation

## 2018-11-12 DIAGNOSIS — Z3A38 38 weeks gestation of pregnancy: Secondary | ICD-10-CM | POA: Insufficient documentation

## 2018-11-12 MED ORDER — FENTANYL CITRATE (PF) 100 MCG/2ML IJ SOLN
100.0000 ug | INTRAMUSCULAR | Status: DC | PRN
  Administered 2018-11-12: 100 ug via INTRAVENOUS
  Filled 2018-11-12: qty 2

## 2018-11-12 MED ORDER — LACTATED RINGERS IV BOLUS
1000.0000 mL | Freq: Once | INTRAVENOUS | Status: AC
  Administered 2018-11-12: 1000 mL via INTRAVENOUS

## 2018-11-12 NOTE — MAU Provider Note (Signed)
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 G2P1001 at 28w4dwho presents to maternity admissions reporting uterine contractions.  This was an Therapist, sports labor eval with Dr Royston Sinner but I was asked to put in discharge orders. She reports good fetal movement, denies LOF, vaginal bleeding, vaginal itching/burning, urinary symptoms, h/a, dizziness, n/v, diarrhea, constipation or fever/chills.  She denies headache, visual changes or RUQ abdominal pain.   Past Medical History: Past Medical History:  Diagnosis Date  . History of chicken pox   . Vaginal delivery 03/18/2017  . Vaginal Pap smear, abnormal     Past obstetric history: OB History  Gravida Para Term Preterm AB Living  2 1 1     1   SAB TAB Ectopic Multiple Live Births        0 1    # Outcome Date GA Lbr Len/2nd Weight Sex Delivery Anes PTL Lv  2 Current           1 Term 03/18/17 [redacted]w[redacted]d / 02:28 3776 g F Vag-Vacuum EPI  LIV    Obstetric Comments  3rd degree tear/episitomy    Past Surgical History: Past Surgical History:  Procedure Laterality Date  . [place]    . LEEP      Family History: Family History  Problem Relation Age of Onset  . Hypertension Father   . High Cholesterol Father   . Heart attack Paternal Grandmother   . Lung cancer Paternal Grandmother   . Asthma Mother   . Arthritis Maternal Grandmother   . Heart disease Maternal Grandmother   . Heart attack Maternal Grandmother     Social History: Social History   Tobacco Use  . Smoking status: Never Smoker  . Smokeless tobacco: Never Used  Substance Use Topics  . Alcohol use: No    Frequency: Never  . Drug use: No    Allergies:  Allergies  Allergen Reactions  . Erythromycin Nausea Only  . Sulfa Antibiotics Nausea Only  . Tetracyclines & Related Nausea Only    Meds:  Medications Prior to Admission  Medication Sig Dispense Refill Last Dose  . Prenatal  Vit-Fe Fumarate-FA (PRENATAL MULTIVITAMIN) TABS tablet Take 1 tablet by mouth daily at 12 noon.   11/12/2018 at Unknown time    I have reviewed patient's Past Medical Hx, Surgical Hx, Family Hx, Social Hx, medications and allergies.   ROS:  Review of Systems  Genitourinary: Positive for pelvic pain. Negative for vaginal bleeding.   Other systems negative  Physical Exam   Patient Vitals for the past 24 hrs:  BP Temp Temp src Pulse Resp SpO2 Height Weight  11/12/18 2235 118/66 97.8 F (36.6 C) Oral 78 16 97 % - -  11/12/18 1800 - - - - - 99 % - -  11/12/18 1759 123/73 - - 95 - - - -  11/12/18 1755 - - - - - 99 % - -  11/12/18 1735 121/65 98.4 F (36.9 C) Oral 89 18 100 % 5\' 2"  (1.575 m) 65.9 kg    PELVIC EXAM: Dilation: 5(5) Effacement (%): 70 Cervical Position: Posterior Station: -2 Presentation: Vertex Exam by:: Gilmer Mor RN  FHT:  Baseline 140 , moderate variability, accelerations present, no decelerations Contractions:  Irregular     Labs: No results found for this or any previous visit (from the past 24 hour(s)). --/--/A NEG (06/22 1905)  Imaging:  No results found.  MAU Course/MDM: Labor eval by RN with communication to Dr Elon SpannerLeger by RN No change in cervx Reactive FHR tracing  Assessment: 1. False labor     Plan: Discharge home Labor precautions and fetal kick counts Follow up in Office for prenatal visits and recheck Follow-up Information    Ranae PilaLeger, Elise Jennifer, MD Follow up.   Specialty: Obstetrics and Gynecology Contact information: [address] [place] [address] [phone]           Pt stable at time of discharge.  Wynelle BourgeoisMarie Jerron Niblack CNM, MSN Certified Nurse-Midwife 11/12/2018 11:14 PM

## 2018-11-12 NOTE — MAU Note (Signed)
Contractions got regular about an hour ago, now every 5-10 min.  Was 4cm on Tues. Some blood colored mucous, no leaking.

## 2018-11-12 NOTE — Discharge Instructions (Signed)

## 2018-11-13 ENCOUNTER — Other Ambulatory Visit (HOSPITAL_COMMUNITY)
Admission: RE | Admit: 2018-11-13 | Discharge: 2018-11-13 | Disposition: A | Discharge status: Home / Self Care | Payer: 59 | Source: Ambulatory Visit

## 2018-11-13 DIAGNOSIS — O479 False labor, unspecified: Secondary | ICD-10-CM | POA: Diagnosis not present

## 2018-11-13 DIAGNOSIS — O471 False labor at or after 37 completed weeks of gestation: Secondary | ICD-10-CM | POA: Diagnosis not present

## 2018-11-13 DIAGNOSIS — Z1159 Encounter for screening for other viral diseases: Secondary | ICD-10-CM | POA: Diagnosis not present

## 2018-11-13 DIAGNOSIS — Z3A38 38 weeks gestation of pregnancy: Secondary | ICD-10-CM | POA: Diagnosis not present

## 2018-11-13 LAB — SARS CORONAVIRUS 2 (TAT 6-24 HRS): SARS Coronavirus 2: NEGATIVE

## 2018-11-16 NOTE — H&P (Signed)
Betty Gentry is a 33 y.o. female presenting for IOL. Pregnancy complicated by a Hx of shoulder dystocia, mild, with 8# 5oz baby. U/S 11/10/18 noted EFW=7/3 12 oz. Cx 4/80/-2/vtx in office 11/10/18.  OB History    Gravida  2   Para  1   Term  1   Preterm      AB      Living  1     SAB      TAB      Ectopic      Multiple  0   Live Births  1        Obstetric Comments  3rd degree tear/episitomy       Past Medical History:  Diagnosis Date  . History of chicken pox   . Vaginal delivery 03/18/2017  . Vaginal Pap smear, abnormal    Past Surgical History:  Procedure Laterality Date  . [place]    . LEEP     Family History: family history includes Arthritis in her maternal grandmother; Asthma in her mother; Heart attack in her maternal grandmother and paternal grandmother; Heart disease in her maternal grandmother; High Cholesterol in her father; Hypertension in her father; Lung cancer in her paternal grandmother. Social History:  reports that she has never smoked. She has never used smokeless tobacco. She reports that she does not drink alcohol or use drugs.     Maternal Diabetes: No Genetic Screening: Normal Maternal Ultrasounds/Referrals: Normal Fetal Ultrasounds or other Referrals:  None Maternal Substance Abuse:  No Significant Maternal Medications:  None Significant Maternal Lab Results:  None Other Comments:  None  Review of Systems  Eyes: Negative for blurred vision.  Gastrointestinal: Negative for abdominal pain.  Neurological: Negative for headaches.   Maternal Medical History:  Fetal activity: Perceived fetal activity is normal.        Last menstrual period 02/15/2018, not currently breastfeeding. Maternal Exam:  Abdomen: Fetal presentation: vertex     Physical Exam  Cardiovascular: Normal rate.  GI: Soft.    Prenatal labs: ABO, Rh: --/--/A NEG (06/22 1905) Antibody: POS (06/22 1905) Rubella:  Immune (01/02 0000) RPR: Nonreactive (01/02 0000)  HBsAg: Negative (01/02 0000)  HIV: Non-reactive (01/02 0000)  GBS: Negative (06/22 0000)   Assessment/Plan: 33 yo G2P1 with favorable cx at term Risk of recurrent shoulder dystocia discussed-patient states she understands and agrees   Shon Millet II 11/16/2018, 5:58 PM

## 2018-11-17 ENCOUNTER — Encounter (HOSPITAL_COMMUNITY): Payer: Self-pay

## 2018-11-17 ENCOUNTER — Inpatient Hospital Stay (HOSPITAL_COMMUNITY): Payer: No Typology Code available for payment source | Admitting: Anesthesiology

## 2018-11-17 ENCOUNTER — Inpatient Hospital Stay (HOSPITAL_COMMUNITY): Payer: No Typology Code available for payment source

## 2018-11-17 ENCOUNTER — Inpatient Hospital Stay (HOSPITAL_COMMUNITY)
Admission: AD | Admit: 2018-11-17 | Discharge: 2018-11-19 | DRG: 807 | Disposition: A | Discharge status: Home / Self Care | Payer: No Typology Code available for payment source | Attending: Obstetrics and Gynecology | Admitting: Obstetrics and Gynecology

## 2018-11-17 ENCOUNTER — Other Ambulatory Visit: Payer: Self-pay

## 2018-11-17 DIAGNOSIS — Z3490 Encounter for supervision of normal pregnancy, unspecified, unspecified trimester: Secondary | ICD-10-CM

## 2018-11-17 DIAGNOSIS — Z3A39 39 weeks gestation of pregnancy: Secondary | ICD-10-CM | POA: Diagnosis not present

## 2018-11-17 DIAGNOSIS — O26893 Other specified pregnancy related conditions, third trimester: Principal | ICD-10-CM | POA: Diagnosis present

## 2018-11-17 HISTORY — DX: Encounter for supervision of normal pregnancy, unspecified, unspecified trimester: Z34.90

## 2018-11-17 LAB — CBC
HCT: 38.6 % (ref 36.0–46.0)
Hemoglobin: 13.2 g/dL (ref 12.0–15.0)
MCH: 30.8 pg (ref 26.0–34.0)
MCHC: 34.2 g/dL (ref 30.0–36.0)
MCV: 90 fL (ref 80.0–100.0)
Platelets: 297 10*3/uL (ref 150–400)
RBC: 4.29 MIL/uL (ref 3.87–5.11)
RDW: 13 % (ref 11.5–15.5)
WBC: 14.4 10*3/uL — ABNORMAL HIGH (ref 4.0–10.5)
nRBC: 0 % (ref 0.0–0.2)

## 2018-11-17 LAB — RPR: RPR Ser Ql: NONREACTIVE

## 2018-11-17 MED ORDER — OXYTOCIN 40 UNITS IN NORMAL SALINE INFUSION - SIMPLE MED: 2.5000 [IU]/h | INTRAVENOUS | Status: DC

## 2018-11-17 MED ORDER — BENZOCAINE-MENTHOL 20-0.5 % EX AERO
1.0000 "application " | INHALATION_SPRAY | CUTANEOUS | Status: DC | PRN
  Administered 2018-11-17: 1 via TOPICAL
  Filled 2018-11-17: qty 56

## 2018-11-17 MED ORDER — SENNOSIDES-DOCUSATE SODIUM 8.6-50 MG PO TABS
2.0000 | ORAL_TABLET | ORAL | Status: DC
  Administered 2018-11-18 (×2): 2 via ORAL
  Filled 2018-11-17 (×2): qty 2

## 2018-11-17 MED ORDER — LIDOCAINE HCL (PF) 1 % IJ SOLN: 30.0000 mL | INTRAMUSCULAR | Status: DC | PRN

## 2018-11-17 MED ORDER — FENTANYL-BUPIVACAINE-NACL 0.5-0.125-0.9 MG/250ML-% EP SOLN
12.0000 mL/h | EPIDURAL | Status: DC | PRN
  Filled 2018-11-17: qty 250

## 2018-11-17 MED ORDER — SODIUM CHLORIDE (PF) 0.9 % IJ SOLN
INTRAMUSCULAR | Status: DC | PRN
  Administered 2018-11-17: 12 mL/h via EPIDURAL

## 2018-11-17 MED ORDER — ZOLPIDEM TARTRATE 5 MG PO TABS: 5.0000 mg | ORAL_TABLET | Freq: Every evening | ORAL | Status: DC | PRN

## 2018-11-17 MED ORDER — COCONUT OIL OIL: 1.0000 "application " | TOPICAL_OIL | Status: DC | PRN

## 2018-11-17 MED ORDER — SIMETHICONE 80 MG PO CHEW: 80.0000 mg | CHEWABLE_TABLET | ORAL | Status: DC | PRN

## 2018-11-17 MED ORDER — EPHEDRINE 5 MG/ML INJ: 10.0000 mg | INTRAVENOUS | Status: DC | PRN

## 2018-11-17 MED ORDER — WITCH HAZEL-GLYCERIN EX PADS: 1.0000 "application " | MEDICATED_PAD | CUTANEOUS | Status: DC | PRN

## 2018-11-17 MED ORDER — OXYTOCIN BOLUS FROM INFUSION
500.0000 mL | Freq: Once | INTRAVENOUS | Status: AC
  Administered 2018-11-17: 500 mL via INTRAVENOUS

## 2018-11-17 MED ORDER — LACTATED RINGERS IV SOLN
500.0000 mL | Freq: Once | INTRAVENOUS | Status: AC
  Administered 2018-11-17: 500 mL via INTRAVENOUS

## 2018-11-17 MED ORDER — DIPHENHYDRAMINE HCL 25 MG PO CAPS
25.0000 mg | ORAL_CAPSULE | Freq: Four times a day (QID) | ORAL | Status: DC | PRN
  Administered 2018-11-17: 25 mg via ORAL
  Filled 2018-11-17: qty 1

## 2018-11-17 MED ORDER — SOD CITRATE-CITRIC ACID 500-334 MG/5ML PO SOLN: 30.0000 mL | ORAL | Status: DC | PRN

## 2018-11-17 MED ORDER — OXYCODONE HCL 5 MG PO TABS
5.0000 mg | ORAL_TABLET | ORAL | Status: DC | PRN
  Administered 2018-11-17 – 2018-11-19 (×5): 5 mg via ORAL
  Filled 2018-11-17 (×5): qty 1

## 2018-11-17 MED ORDER — ONDANSETRON HCL 4 MG PO TABS: 4.0000 mg | ORAL_TABLET | ORAL | Status: DC | PRN

## 2018-11-17 MED ORDER — OXYTOCIN 40 UNITS IN NORMAL SALINE INFUSION - SIMPLE MED
1.0000 m[IU]/min | INTRAVENOUS | Status: DC
  Administered 2018-11-17: 07:00:00 2 m[IU]/min via INTRAVENOUS
  Filled 2018-11-17: qty 1000

## 2018-11-17 MED ORDER — PRENATAL MULTIVITAMIN CH
1.0000 | ORAL_TABLET | Freq: Every day | ORAL | Status: DC
  Administered 2018-11-18: 1 via ORAL
  Filled 2018-11-17: qty 1

## 2018-11-17 MED ORDER — PHENYLEPHRINE 40 MCG/ML (10ML) SYRINGE FOR IV PUSH (FOR BLOOD PRESSURE SUPPORT): 80.0000 ug | PREFILLED_SYRINGE | INTRAVENOUS | Status: DC | PRN

## 2018-11-17 MED ORDER — TERBUTALINE SULFATE 1 MG/ML IJ SOLN: 0.2500 mg | Freq: Once | INTRAMUSCULAR | Status: DC | PRN

## 2018-11-17 MED ORDER — LACTATED RINGERS IV SOLN: 500.0000 mL | INTRAVENOUS | Status: DC | PRN

## 2018-11-17 MED ORDER — ACETAMINOPHEN 325 MG PO TABS: 650.0000 mg | ORAL_TABLET | ORAL | Status: DC | PRN

## 2018-11-17 MED ORDER — PHENYLEPHRINE 40 MCG/ML (10ML) SYRINGE FOR IV PUSH (FOR BLOOD PRESSURE SUPPORT)
80.0000 ug | PREFILLED_SYRINGE | INTRAVENOUS | Status: DC | PRN
  Filled 2018-11-17: qty 10

## 2018-11-17 MED ORDER — OXYCODONE-ACETAMINOPHEN 5-325 MG PO TABS: 1.0000 | ORAL_TABLET | ORAL | Status: DC | PRN

## 2018-11-17 MED ORDER — OXYCODONE HCL 5 MG PO TABS
10.0000 mg | ORAL_TABLET | ORAL | Status: DC | PRN
  Administered 2018-11-18 (×4): 10 mg via ORAL
  Filled 2018-11-17 (×4): qty 2

## 2018-11-17 MED ORDER — ONDANSETRON HCL 4 MG/2ML IJ SOLN: 4.0000 mg | INTRAMUSCULAR | Status: DC | PRN

## 2018-11-17 MED ORDER — LACTATED RINGERS IV SOLN
INTRAVENOUS | Status: DC
  Administered 2018-11-17: 09:00:00 via INTRAVENOUS
  Administered 2018-11-17: 125 mL/h via INTRAVENOUS

## 2018-11-17 MED ORDER — DIBUCAINE (PERIANAL) 1 % EX OINT: 1.0000 "application " | TOPICAL_OINTMENT | CUTANEOUS | Status: DC | PRN

## 2018-11-17 MED ORDER — LIDOCAINE HCL (PF) 1 % IJ SOLN
INTRAMUSCULAR | Status: DC | PRN
  Administered 2018-11-17: 6 mL via EPIDURAL

## 2018-11-17 MED ORDER — TETANUS-DIPHTH-ACELL PERTUSSIS 5-2.5-18.5 LF-MCG/0.5 IM SUSP: 0.5000 mL | Freq: Once | INTRAMUSCULAR | Status: DC

## 2018-11-17 MED ORDER — ACETAMINOPHEN 325 MG PO TABS
650.0000 mg | ORAL_TABLET | ORAL | Status: DC | PRN
  Administered 2018-11-17 – 2018-11-19 (×8): 650 mg via ORAL
  Filled 2018-11-17 (×8): qty 2

## 2018-11-17 MED ORDER — BUTORPHANOL TARTRATE 1 MG/ML IJ SOLN: 1.0000 mg | INTRAMUSCULAR | Status: DC | PRN

## 2018-11-17 MED ORDER — OXYCODONE-ACETAMINOPHEN 5-325 MG PO TABS: 2.0000 | ORAL_TABLET | ORAL | Status: DC | PRN

## 2018-11-17 MED ORDER — ONDANSETRON HCL 4 MG/2ML IJ SOLN
4.0000 mg | Freq: Four times a day (QID) | INTRAMUSCULAR | Status: DC | PRN
  Administered 2018-11-17: 4 mg via INTRAVENOUS
  Filled 2018-11-17: qty 2

## 2018-11-17 MED ORDER — DIPHENHYDRAMINE HCL 50 MG/ML IJ SOLN
12.5000 mg | INTRAMUSCULAR | Status: DC | PRN
  Administered 2018-11-17 (×2): 12.5 mg via INTRAVENOUS
  Filled 2018-11-17: qty 1

## 2018-11-17 MED ORDER — FLEET ENEMA 7-19 GM/118ML RE ENEM: 1.0000 | ENEMA | RECTAL | Status: DC | PRN

## 2018-11-17 MED ORDER — IBUPROFEN 600 MG PO TABS
600.0000 mg | ORAL_TABLET | Freq: Four times a day (QID) | ORAL | Status: DC
  Administered 2018-11-17 – 2018-11-19 (×7): 600 mg via ORAL
  Filled 2018-11-17 (×7): qty 1

## 2018-11-17 NOTE — Plan of Care (Signed)
  Problem: Education: Goal: Knowledge of condition will improve Description: Admission education, safety and unit protocols reviewed with patient and significant other. Encouraged patient to call out for assistance to the bathroom. Betty Gentry  Note: Admission education, safety and unit protocols reviewed with patient and significant other. Encouraged patient to call out for assistance to go to the bathroom. Betty Gentry, Leretha Dykes [place]

## 2018-11-17 NOTE — Anesthesia Preprocedure Evaluation (Signed)
Anesthesia Evaluation  Patient identified by MRN, date of birth, ID band Patient awake    Reviewed: Allergy & Precautions, H&P , NPO status , Patient's Chart, lab work & pertinent test results, reviewed documented beta blocker date and time   Airway Mallampati: I  TM Distance: >3 FB Neck ROM: full    Dental no notable dental hx. (+) Teeth Intact   Pulmonary neg pulmonary ROS,    Pulmonary exam normal breath sounds clear to auscultation       Cardiovascular negative cardio ROS Normal cardiovascular exam Rhythm:regular Rate:Normal     Neuro/Psych negative neurological ROS  negative psych ROS   GI/Hepatic negative GI ROS, Neg liver ROS,   Endo/Other  negative endocrine ROS  Renal/GU negative Renal ROS  negative genitourinary   Musculoskeletal   Abdominal   Peds  Hematology negative hematology ROS (+)   Anesthesia Other Findings   Reproductive/Obstetrics (+) Pregnancy                             Anesthesia Physical Anesthesia Plan  ASA: II  Anesthesia Plan: Epidural   Post-op Pain Management:    Induction:   PONV Risk Score and Plan:   Airway Management Planned:   Additional Equipment:   Intra-op Plan:   Post-operative Plan:   Informed Consent: I have reviewed the patients History and Physical, chart, labs and discussed the procedure including the risks, benefits and alternatives for the proposed anesthesia with the patient or authorized representative who has indicated his/her understanding and acceptance.     Plan Discussed with:   Anesthesia Plan Comments:         Anesthesia Quick Evaluation  

## 2018-11-17 NOTE — Progress Notes (Signed)
FHT cat one Cx 5/70/-2/vtx AROM clear D/W patient above. If UCs do not start will begin Pitocin

## 2018-11-17 NOTE — Anesthesia Procedure Notes (Signed)
Epidural Patient location during procedure: OB Start time: 11/17/2018 8:46 AM End time: 11/17/2018 9:50 AM  Staffing Anesthesiologist: Janeece Riggers, MD  Preanesthetic Checklist Completed: patient identified, site marked, surgical consent, pre-op evaluation, timeout performed, IV checked, risks and benefits discussed and monitors and equipment checked  Epidural Patient position: sitting Prep: site prepped and draped and DuraPrep Patient monitoring: continuous pulse ox and blood pressure Approach: midline Location: L3-L4 Injection technique: LOR air  Needle:  Needle type: Tuohy  Needle gauge: 17 G Needle length: 9 cm and 9 Needle insertion depth: 4 cm Catheter type: closed end flexible Catheter size: 19 Gauge Catheter at skin depth: 9 cm Test dose: negative  Assessment Events: blood not aspirated, injection not painful, no injection resistance, negative IV test and no paresthesia

## 2018-11-17 NOTE — Progress Notes (Signed)
Delivery Note At 1:47 PM a viable female was delivered via Vaginal, Spontaneous (Presentation:LOA ;  ).  APGAR: , ; weight  .   Placenta status:intact , .  Cord:3 vessels  with the following complications: .  Cord pH:   Anesthesia:   Episiotomy: None Lacerations:  intact Suture Repair:  Est. Blood Loss (mL):  50  Mom to postpartum.  Baby to Couplet care / Skin to Skin.  Betty Gentry 11/17/2018, 1:59 PM

## 2018-11-18 LAB — CBC
HCT: 39.1 % (ref 36.0–46.0)
Hemoglobin: 12.9 g/dL (ref 12.0–15.0)
MCH: 31.1 pg (ref 26.0–34.0)
MCHC: 33 g/dL (ref 30.0–36.0)
MCV: 94.2 fL (ref 80.0–100.0)
Platelets: 246 10*3/uL (ref 150–400)
RBC: 4.15 MIL/uL (ref 3.87–5.11)
RDW: 13.2 % (ref 11.5–15.5)
WBC: 17 10*3/uL — ABNORMAL HIGH (ref 4.0–10.5)
nRBC: 0.1 % (ref 0.0–0.2)

## 2018-11-18 MED ORDER — RHO D IMMUNE GLOBULIN 1500 UNIT/2ML IJ SOSY
300.0000 ug | PREFILLED_SYRINGE | Freq: Once | INTRAMUSCULAR | Status: AC
  Administered 2018-11-18: 300 ug via INTRAVENOUS
  Filled 2018-11-18: qty 2

## 2018-11-18 NOTE — Progress Notes (Signed)
Patient doing well No complaints  BP 120/76 (BP Location: Right Arm)   Pulse (!) 54   Temp 97.8 F (36.6 C) (Oral)   Resp 16   Ht 5\' 2"  (1.575 m)   Wt 65.5 kg   LMP 02/15/2018   SpO2 100%   Breastfeeding Unknown   BMI 26.39 kg/m  Results for orders placed or performed during the hospital encounter of 11/17/18 (from the past 24 hour(s))  CBC     Status: Abnormal   Collection Time: 11/18/18  5:48 AM  Result Value Ref Range   WBC 17.0 (H) 4.0 - 10.5 K/uL   RBC 4.15 3.87 - 5.11 MIL/uL   Hemoglobin 12.9 12.0 - 15.0 g/dL   HCT 39.1 36.0 - 46.0 %   MCV 94.2 80.0 - 100.0 fL   MCH 31.1 26.0 - 34.0 pg   MCHC 33.0 30.0 - 36.0 g/dL   RDW 13.2 11.5 - 15.5 %   Platelets 246 150 - 400 K/uL   nRBC 0.1 0.0 - 0.2 %   Abdomen is soft and non tender Lochia WNL  PPD # 1  Doing well Routine care circ today Discharge home tomorrow due to baby DAT +

## 2018-11-18 NOTE — Addendum Note (Signed)
Addendum  created 11/18/18 0746 by Flossie Dibble, CRNA   Charge Capture section accepted, Clinical Note Signed

## 2018-11-18 NOTE — Anesthesia Postprocedure Evaluation (Signed)
Anesthesia Post Note  Patient: [name]  Procedure(s) Performed: AN AD [place]     Patient location during evaluation: Mother Baby Anesthesia Type: Epidural Level of consciousness: awake and alert and oriented Pain management: satisfactory to patient Vital Signs Assessment: post-procedure vital signs reviewed and stable Respiratory status: respiratory function stable Cardiovascular status: stable Postop Assessment: no headache, no backache, epidural receding, patient able to bend at knees, no signs of nausea or vomiting and adequate PO intake Anesthetic complications: no    Last Vitals:  Vitals:   11/18/18 0134 11/18/18 0513  BP: 103/67 120/76  Pulse: (!) 52 (!) 54  Resp: 16 16  Temp: 36.5 C 36.6 C  SpO2:      Last Pain:  Vitals:   11/18/18 0715  TempSrc:   PainSc: 6    Pain Goal: Patients Stated Pain Goal: 2 (11/18/18 0715)                 [name]

## 2018-11-18 NOTE — Anesthesia Postprocedure Evaluation (Signed)
Anesthesia Post Note  Patient: Betty Gentry  Procedure(s) Performed: AN AD HOC LABOR EPIDURAL     Anesthesia Post Evaluation  Last Vitals:  Vitals:   11/18/18 0134 11/18/18 0513  BP: 103/67 120/76  Pulse: (!) 52 (!) 54  Resp: 16 16  Temp: 36.5 C 36.6 C  SpO2:      Last Pain:  Vitals:   11/18/18 0513  TempSrc: Oral  PainSc: 6                  Kellan Raffield

## 2018-11-19 LAB — RH IG WORKUP (INCLUDES ABO/RH)
ABO/RH(D): A NEG
Fetal Screen: NEGATIVE
Gestational Age(Wks): 39.2
Unit division: 0

## 2018-11-19 MED ORDER — OXYCODONE-ACETAMINOPHEN 5-325 MG PO TABS: 1.0000 | ORAL_TABLET | Freq: Four times a day (QID) | ORAL | 0 refills | Status: DC | PRN

## 2018-11-19 MED ORDER — IBUPROFEN 600 MG PO TABS: 600.0000 mg | ORAL_TABLET | Freq: Four times a day (QID) | ORAL | 0 refills | Status: DC

## 2018-11-19 NOTE — Discharge Summary (Signed)
Obstetric Discharge Summary Reason for Admission: induction of labor Prenatal Procedures: ultrasound Intrapartum Procedures: spontaneous vaginal delivery Postpartum Procedures: none Complications-Operative and Postpartum: none Hemoglobin  Date Value Ref Range Status  11/18/2018 12.9 12.0 - 15.0 g/dL Final   HCT  Date Value Ref Range Status  11/18/2018 39.1 36.0 - 46.0 % Final    Physical Exam:  General: alert and cooperative Lochia: appropriate Uterine Fundus: firm Incision: n/a DVT Evaluation: No evidence of DVT seen on physical exam.  Discharge Diagnoses: Term Pregnancy-delivered  Discharge Information: Date: 11/19/2018 Activity: pelvic rest Diet: routine Medications: PNV, Ibuprofen and Percocet Condition: stable Instructions: refer to practice specific booklet Discharge to: home Follow-up Information    [place], [facility] Schedule an appointment as soon as possible for a visit in 6 week(s).   Contact information: [address] [place] [address] [phone]           Newborn Data: Live born female  Birth Weight: 7 lb 6.7 oz (3365 g) APGAR: 39, 9  Newborn Delivery   Birth date/time: 11/17/2018 13:47:00 Delivery type: Vaginal, Spontaneous      Home with mother.  Marylynn Pearson 11/19/2018, 9:02 AM

## 2018-11-21 LAB — TYPE AND SCREEN
ABO/RH(D): A NEG
Antibody Screen: POSITIVE
Unit division: 0
Unit division: 0

## 2018-11-21 LAB — BPAM RBC
Blood Product Expiration Date: 202008202359
Blood Product Expiration Date: 202008212359
Unit Type and Rh: 600
Unit Type and Rh: 600

## 2019-03-01 ENCOUNTER — Telehealth: Payer: Self-pay | Admitting: Physician Assistant

## 2019-03-01 NOTE — Telephone Encounter (Signed)
Medication Refill - Medication: Buspirone 7.5  Has the patient contacted their pharmacy? Yes - has upcoming med refill appt, but is completely out (Agent: If no, request that the patient contact the pharmacy for the refill.) (Agent: If yes, when and what did the pharmacy advise?)  Preferred Pharmacy (with phone number or street name):  [id number] - [place], [address] [place] [phone] (Phone) [phone] (Fax)   Agent: Please be advised that RX refills may take up to 3 business days. We ask that you follow-up with your pharmacy.

## 2019-03-01 NOTE — Telephone Encounter (Signed)
See note

## 2019-03-01 NOTE — Telephone Encounter (Signed)
Pt requesting refill on Buspar 7.5 mg. Last seen 01/2018. Pt had baby in July.

## 2019-03-01 NOTE — Telephone Encounter (Signed)
Please call pt and schedule her with someone tomorrow to discuss her Buspar. Aldona Bar will not refill due to pt not seen in over a year.

## 2019-03-01 NOTE — Telephone Encounter (Signed)
Needs appointment

## 2019-03-01 NOTE — Telephone Encounter (Signed)
Patient is scheduled with Dr. Jonni Sanger tomorrow afternoon

## 2019-03-01 NOTE — Telephone Encounter (Signed)
Medication: Buspirone 7.5 no on current medication list.

## 2019-03-02 ENCOUNTER — Ambulatory Visit: Payer: 59 | Admitting: Family Medicine

## 2019-03-02 ENCOUNTER — Ambulatory Visit (INDEPENDENT_AMBULATORY_CARE_PROVIDER_SITE_OTHER): Payer: 59 | Admitting: Family Medicine

## 2019-03-02 DIAGNOSIS — F419 Anxiety disorder, unspecified: Secondary | ICD-10-CM | POA: Diagnosis not present

## 2019-03-02 MED ORDER — SERTRALINE HCL 100 MG PO TABS: 100.0000 mg | ORAL_TABLET | Freq: Every day | ORAL | 3 refills | Status: DC

## 2019-03-02 MED ORDER — BUSPIRONE HCL 7.5 MG PO TABS: 7.5000 mg | ORAL_TABLET | Freq: Three times a day (TID) | ORAL | 3 refills | Status: DC

## 2019-03-02 NOTE — Assessment & Plan Note (Signed)
Chronic problem.  Slightly worsened.  Will increase Zoloft 100 mg daily.  Continue BuSpar 7.5 mg 3 times daily.  She has good support structure at home.  Advised her to follow-up in a few weeks if symptoms are not improving.

## 2019-03-02 NOTE — Progress Notes (Signed)
    Chief Complaint:  Betty Gentry is a 33 y.o. female who presents today for a virtual office visit with a chief complaint of anxiety.   Assessment/Plan:  Anxiety Chronic problem.  Slightly worsened.  Will increase Zoloft 100 mg daily.  Continue BuSpar 7.5 mg 3 times daily.  She has good support structure at home.  Advised her to follow-up in a few weeks if symptoms are not improving.     Subjective:  HPI:  Anxiety - Currently on zoloft 50 mg daily and buspar 7.5mg  three times daily -Had baby 4 months ago.  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 -She is not breast-feeding  ROS: Per HPI  PMH: She reports that she has never smoked. She has never used smokeless tobacco. She reports that she does not drink alcohol or use drugs.      Objective/Observations  Physical Exam: Gen: NAD, resting comfortably Pulm: Normal work of breathing Neuro: Grossly normal, moves all extremities Psych: Normal affect and thought content  Virtual Visit via Video   I connected with Betty Gentry on 03/02/19 at 11:20 AM EST by a video enabled telemedicine application and verified that I am speaking with the correct person using two identifiers. I discussed the limitations of evaluation and management by telemedicine and the availability of in person appointments. The patient expressed understanding and agreed to proceed.   Patient location: Home Provider location: [place] participating in the virtual visit: Myself and Patient     Betty Gentry. Jerline Pain, MD 03/02/2019 10:37 AM

## 2019-03-05 ENCOUNTER — Telehealth: Payer: No Typology Code available for payment source | Admitting: Physician Assistant

## 2019-04-13 IMAGING — US US ABDOMEN LIMITED
1 series · 14 of 25 positions shown · non-contrast
Comparison: None.

CLINICAL DATA: Right upper quadrant pain, nausea and vomiting for 6
days

EXAM:
ULTRASOUND ABDOMEN LIMITED RIGHT UPPER QUADRANT

[Series 1: us abdomen limited · 0.14mm/px · 14 of 41 slices shown]
[im 1/41]
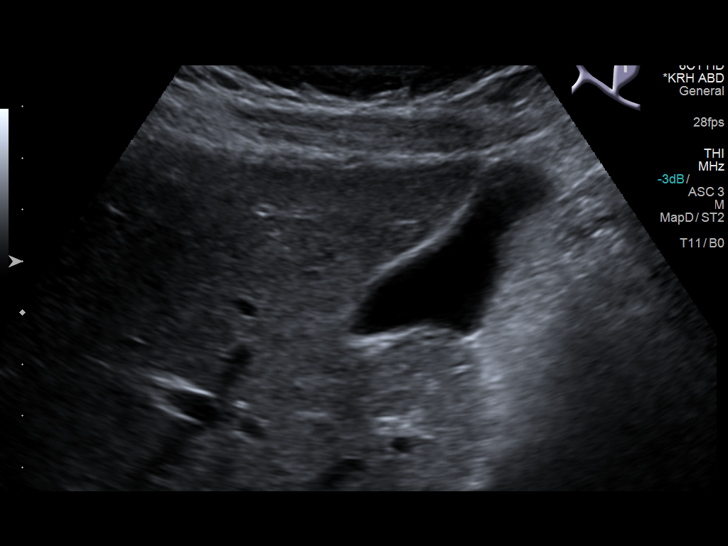
[im 4/41]
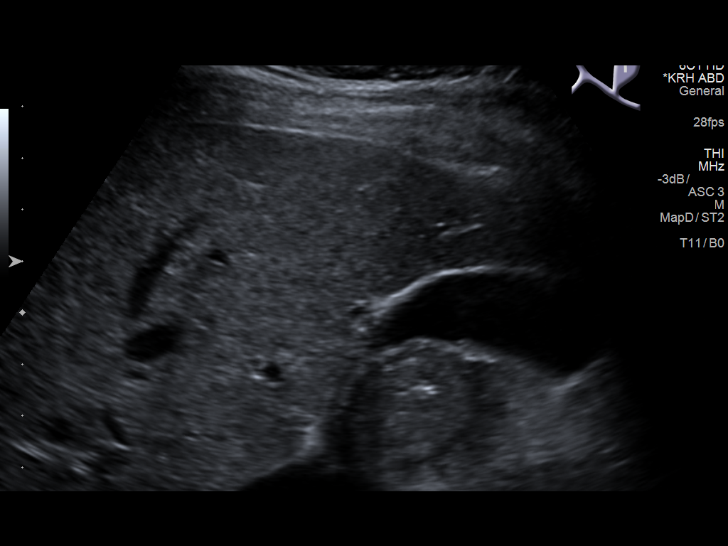
[im 7/41]
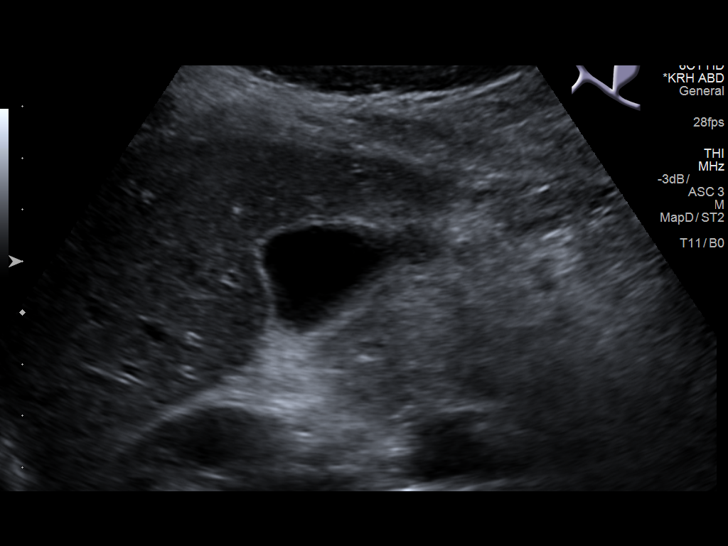
[im 11/41]
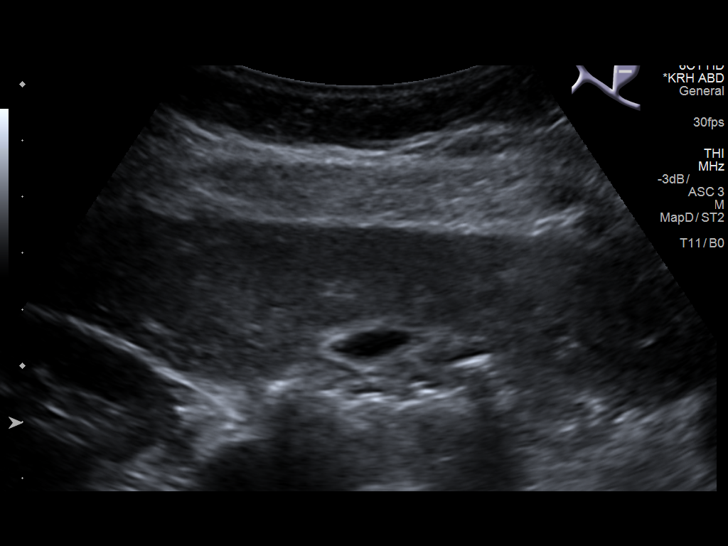
[im 14/41]
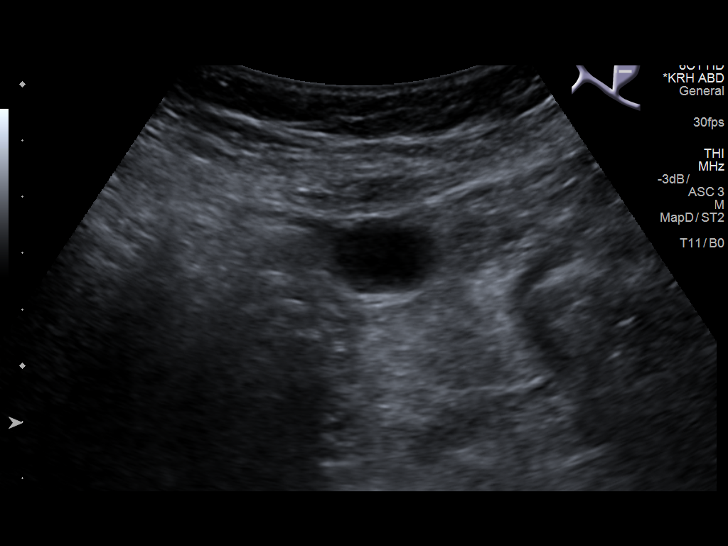
[im 16/41]
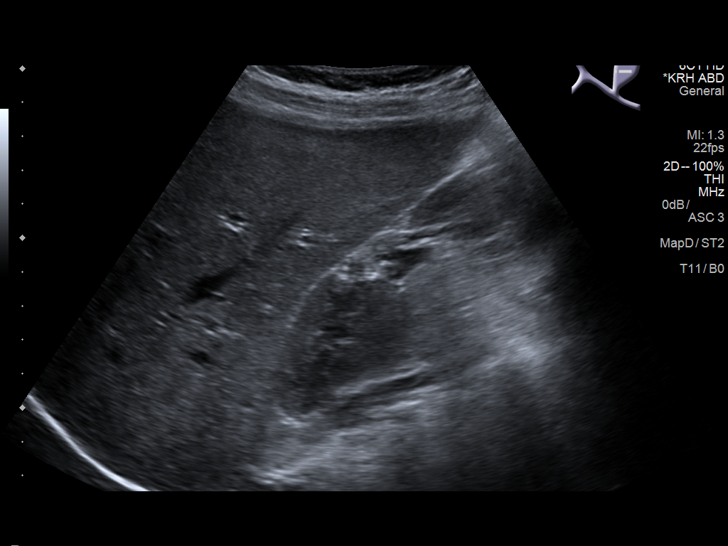
[im 19/41]
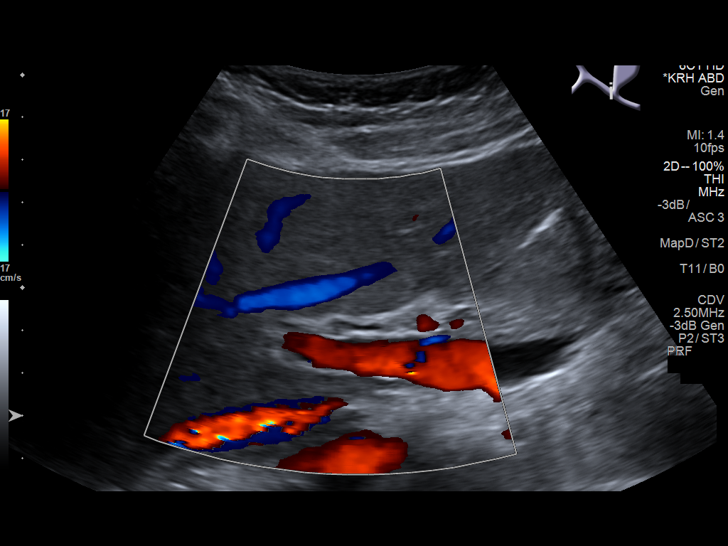
[im 22/41]
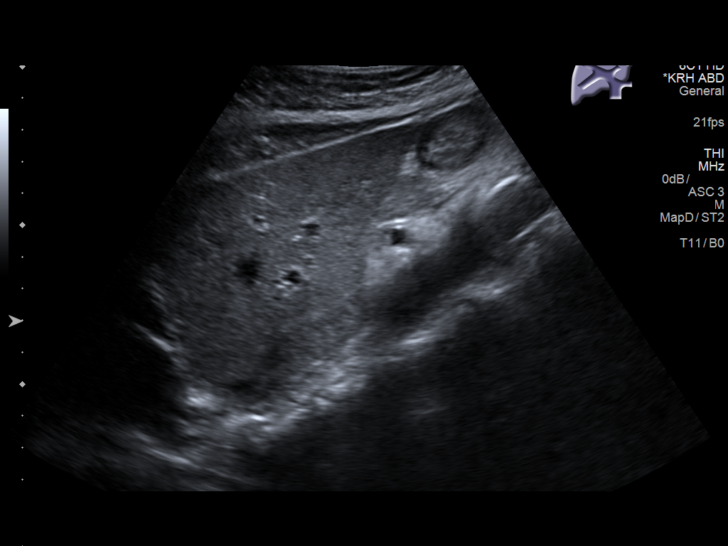
[im 26/41]
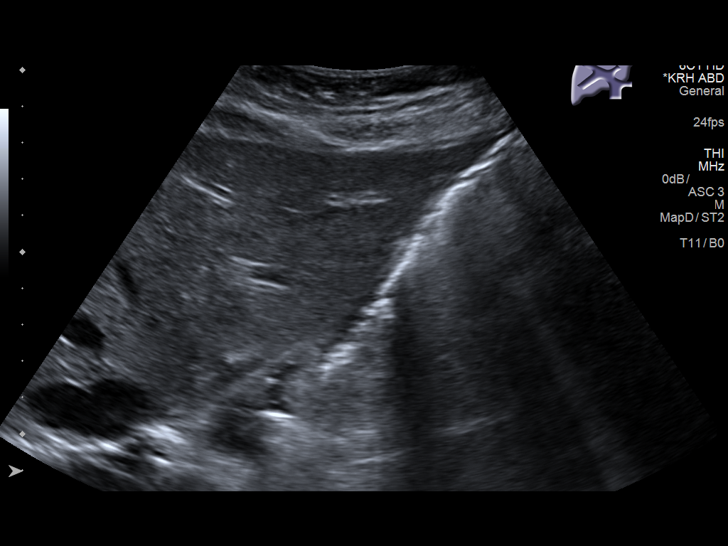
[im 27/41]
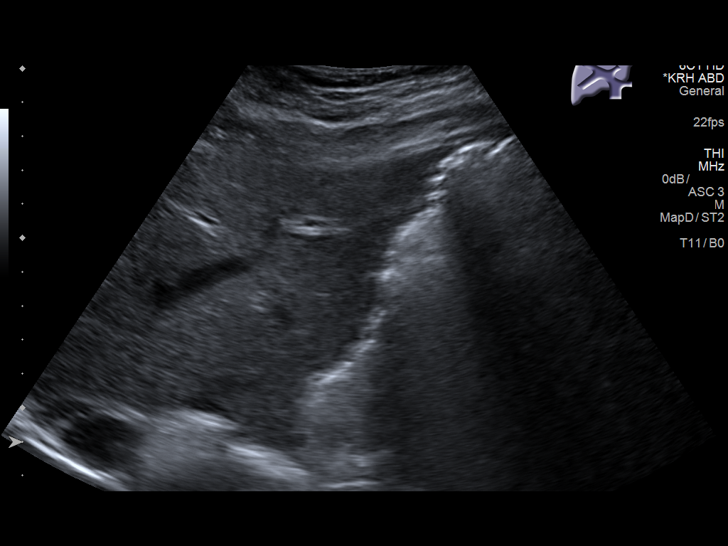
[im 31/41]
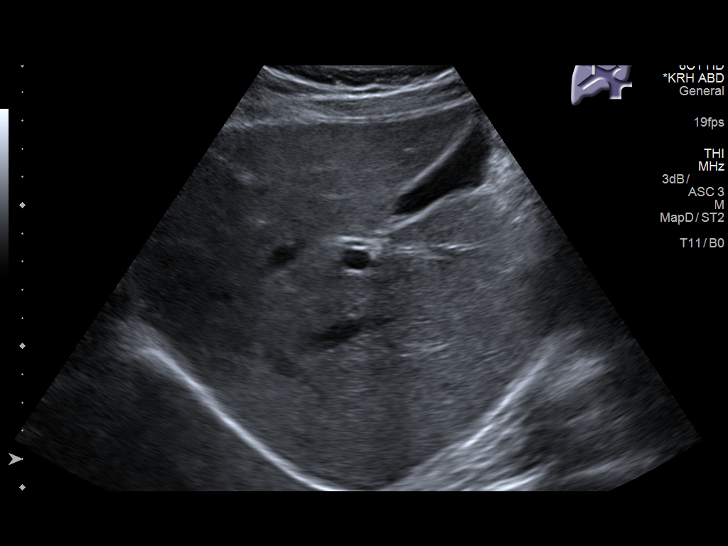
[im 34/41]
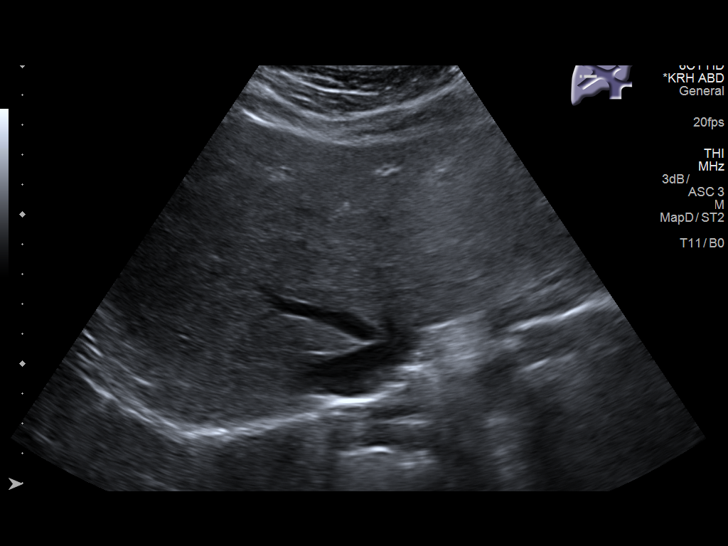
[im 37/41]
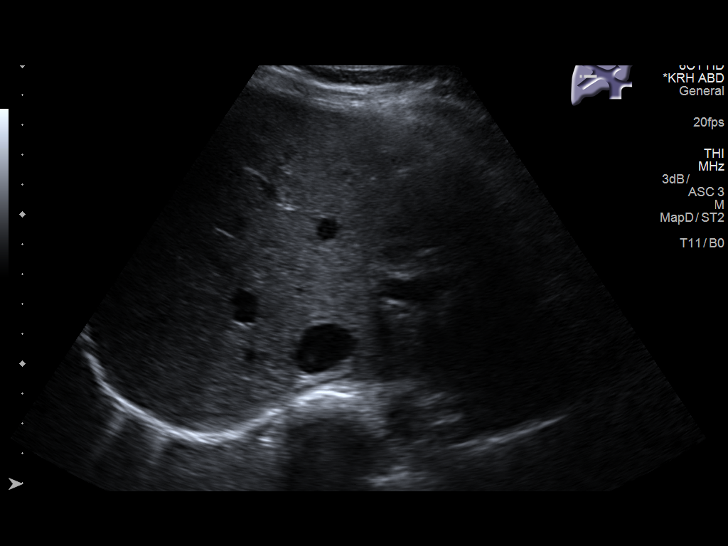
[im 41/41]
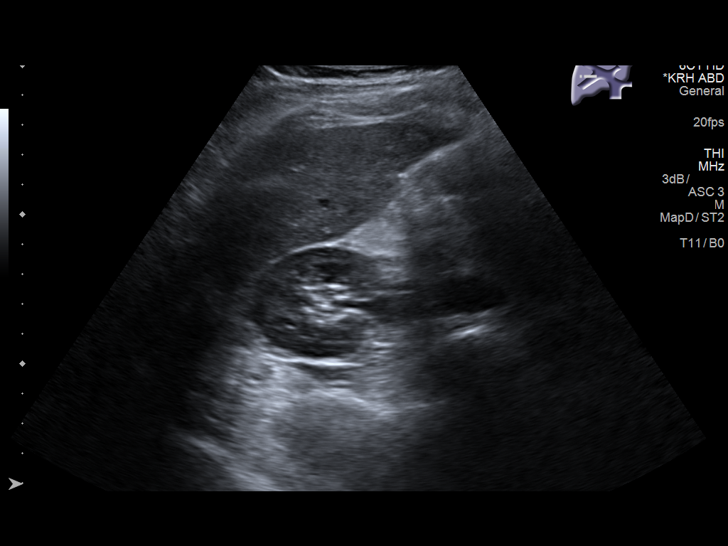

[14 of 25 positions shown; findings below may reference images not displayed]

FINDINGS: Gallbladder:

No gallstones or wall thickening visualized. No sonographic Murphy
sign noted by sonographer.

Common bile duct:

Diameter: 2.6 mm

Liver:

No focal lesion identified. Within normal limits in parenchymal
echogenicity. Portal vein is patent on color Doppler imaging with
normal direction of blood flow towards the liver.
IMPRESSION: Normal liver, gallbladder and bile ducts.

## 2019-08-30 DIAGNOSIS — M545 Low back pain: Secondary | ICD-10-CM | POA: Diagnosis not present

## 2019-09-10 DIAGNOSIS — M545 Low back pain: Secondary | ICD-10-CM | POA: Diagnosis not present

## 2019-10-23 DIAGNOSIS — M79642 Pain in left hand: Secondary | ICD-10-CM | POA: Diagnosis not present

## 2019-12-15 ENCOUNTER — Other Ambulatory Visit: Payer: Self-pay

## 2019-12-17 ENCOUNTER — Encounter: Payer: Self-pay | Admitting: Nurse Practitioner

## 2019-12-17 ENCOUNTER — Other Ambulatory Visit (HOSPITAL_COMMUNITY)
Admission: RE | Admit: 2019-12-17 | Discharge: 2019-12-17 | Disposition: A | Discharge status: Home / Self Care | Payer: Medicaid Other | Source: Ambulatory Visit | Attending: Nurse Practitioner | Admitting: Nurse Practitioner

## 2019-12-17 ENCOUNTER — Other Ambulatory Visit: Payer: Self-pay

## 2019-12-17 ENCOUNTER — Ambulatory Visit (INDEPENDENT_AMBULATORY_CARE_PROVIDER_SITE_OTHER): Payer: Medicaid Other | Admitting: Nurse Practitioner

## 2019-12-17 VITALS — BP 106/62 | HR 80 | Temp 98.4°F | Ht 63.0 in | Wt 127.0 lb

## 2019-12-17 DIAGNOSIS — Z114 Encounter for screening for human immunodeficiency virus [HIV]: Secondary | ICD-10-CM | POA: Diagnosis not present

## 2019-12-17 DIAGNOSIS — Z0000 Encounter for general adult medical examination without abnormal findings: Secondary | ICD-10-CM | POA: Diagnosis not present

## 2019-12-17 DIAGNOSIS — R5383 Other fatigue: Secondary | ICD-10-CM

## 2019-12-17 DIAGNOSIS — Z1159 Encounter for screening for other viral diseases: Secondary | ICD-10-CM | POA: Diagnosis not present

## 2019-12-17 DIAGNOSIS — Z23 Encounter for immunization: Secondary | ICD-10-CM | POA: Diagnosis not present

## 2019-12-17 DIAGNOSIS — F419 Anxiety disorder, unspecified: Secondary | ICD-10-CM | POA: Diagnosis not present

## 2019-12-17 DIAGNOSIS — N760 Acute vaginitis: Secondary | ICD-10-CM

## 2019-12-17 DIAGNOSIS — O99345 Other mental disorders complicating the puerperium: Secondary | ICD-10-CM

## 2019-12-17 DIAGNOSIS — F53 Postpartum depression: Secondary | ICD-10-CM

## 2019-12-17 HISTORY — DX: Other fatigue: R53.83

## 2019-12-17 HISTORY — DX: Acute vaginitis: N76.0

## 2019-12-17 LAB — CBC WITH DIFFERENTIAL/PLATELET
Basophils Absolute: 0 10*3/uL (ref 0.0–0.1)
Basophils Relative: 0.5 % (ref 0.0–3.0)
Eosinophils Absolute: 0.3 10*3/uL (ref 0.0–0.7)
Eosinophils Relative: 4.1 % (ref 0.0–5.0)
HCT: 40.6 % (ref 36.0–46.0)
Hemoglobin: 13.8 g/dL (ref 12.0–15.0)
Lymphocytes Relative: 25.8 % (ref 12.0–46.0)
Lymphs Abs: 1.8 10*3/uL (ref 0.7–4.0)
MCHC: 34 g/dL (ref 30.0–36.0)
MCV: 88.9 fl (ref 78.0–100.0)
Monocytes Absolute: 0.4 10*3/uL (ref 0.1–1.0)
Monocytes Relative: 5.4 % (ref 3.0–12.0)
Neutro Abs: 4.5 10*3/uL (ref 1.4–7.7)
Neutrophils Relative %: 64.2 % (ref 43.0–77.0)
Platelets: 287 10*3/uL (ref 150.0–400.0)
RBC: 4.56 Mil/uL (ref 3.87–5.11)
RDW: 12.8 % (ref 11.5–15.5)
WBC: 7.1 10*3/uL (ref 4.0–10.5)

## 2019-12-17 LAB — LIPID PANEL
Cholesterol: 187 mg/dL (ref 0–200)
HDL: 51.4 mg/dL (ref >39.00)
LDL Cholesterol: 95 mg/dL (ref 0–99)
NonHDL: 135.42
Total CHOL/HDL Ratio: 4
Triglycerides: 200 mg/dL — ABNORMAL HIGH (ref 0.0–149.0)
VLDL: 40 mg/dL (ref 0.0–40.0)

## 2019-12-17 LAB — HEMOGLOBIN A1C: Hgb A1c MFr Bld: 5 % (ref 4.6–6.5)

## 2019-12-17 LAB — COMPREHENSIVE METABOLIC PANEL
ALT: 9 U/L (ref 0–35)
AST: 14 U/L (ref 0–37)
Albumin: 4.2 g/dL (ref 3.5–5.2)
Alkaline Phosphatase: 57 U/L (ref 39–117)
BUN: 8 mg/dL (ref 6–23)
CO2: 28 mEq/L (ref 19–32)
Calcium: 9.3 mg/dL (ref 8.4–10.5)
Chloride: 103 mEq/L (ref 96–112)
Creatinine, Ser: 0.79 mg/dL (ref 0.40–1.20)
GFR: 83.88 mL/min (ref >60.00)
Glucose, Bld: 91 mg/dL (ref 70–99)
Potassium: 4 mEq/L (ref 3.5–5.1)
Sodium: 139 mEq/L (ref 135–145)
Total Bilirubin: 0.4 mg/dL (ref 0.2–1.2)
Total Protein: 6.6 g/dL (ref 6.0–8.3)

## 2019-12-17 LAB — TSH: TSH: 2.54 u[IU]/mL (ref 0.35–4.50)

## 2019-12-17 LAB — POCT URINE PREGNANCY: Preg Test, Ur: NEGATIVE

## 2019-12-17 LAB — B12 AND FOLATE PANEL
Folate: 15.8 ng/mL (ref >5.9)
Vitamin B-12: 257 pg/mL (ref 211–911)

## 2019-12-17 LAB — VITAMIN D 25 HYDROXY (VIT D DEFICIENCY, FRACTURES): VITD: 46.86 ng/mL (ref 30.00–100.00)

## 2019-12-17 MED ORDER — FLUCONAZOLE 150 MG PO TABS: 150.0000 mg | ORAL_TABLET | Freq: Once | ORAL | 0 refills | Status: DC

## 2019-12-17 MED ORDER — BUSPIRONE HCL 7.5 MG PO TABS: 7.5000 mg | ORAL_TABLET | Freq: Four times a day (QID) | ORAL | 3 refills | Status: AC

## 2019-12-17 NOTE — Patient Instructions (Addendum)
Labs today  Await pregnancy test .   Await vaginal swab results for medication management  Referral into Dr. 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 Increase your buspirone to 7.5 mg 4 times daily.  She can continue to take 2 tablets in the morning, one at lunch, and now we will add 1 in the evening.  She can take the Zoloft at bedtime.  We will see if this helps your anxiety.  Follow-up office visit in 1 month to go results, and complete your physical exam.    Vaginitis Vaginitis is a condition in which the vaginal tissue swells and becomes red (inflamed). This condition is most often caused by a change in the normal balance of bacteria and yeast that live in the vagina. This change causes an overgrowth of certain bacteria or yeast, which causes the inflammation. There are different types of vaginitis, but the most common types are:  Bacterial vaginosis.  Yeast infection (candidiasis).  Trichomoniasis vaginitis. This is a sexually transmitted disease (STD).  Viral vaginitis.  Atrophic vaginitis.  Allergic vaginitis. What are the causes? The cause of this condition depends on the type of vaginitis. It can be caused by:  Bacteria (bacterial vaginosis).  Yeast, which is a fungus (yeast infection).  A parasite (trichomoniasis vaginitis).  A virus (viral vaginitis).  Low hormone levels (atrophic vaginitis). Low hormone levels can occur during pregnancy, breastfeeding, or after menopause.  Irritants, such as bubble baths, scented tampons, and feminine sprays (allergic vaginitis). Other factors can change the normal balance of the yeast and bacteria that live in the vagina. These include:  Antibiotic medicines.  Poor hygiene.  Diaphragms, vaginal sponges, spermicides, birth control pills, and intrauterine devices (IUD).  Sex.  Infection.  Uncontrolled diabetes.  A weakened defense (immune) system. What  increases the risk? This condition is more likely to develop in women who:  Smoke.  Use vaginal douches, scented tampons, or scented sanitary pads.  Wear tight-fitting pants.  Wear thong underwear.  Use oral birth control pills or an IUD.  Have sex without a condom.  Have multiple sex partners.  Have an STD.  Frequently use the spermicide nonoxynol-9.  Eat lots of foods high in sugar.  Have uncontrolled diabetes.  Have low estrogen levels.  Have a weakened immune system from an immune disorder or medical treatment.  Are pregnant or breastfeeding. What are the signs or symptoms? Symptoms vary depending on the cause of the vaginitis. Common symptoms include:  Abnormal vaginal discharge. ? The discharge is white, gray, or yellow with bacterial vaginosis. ? The discharge is thick, white, and cheesy with a yeast infection. ? The discharge is frothy and yellow or greenish with trichomoniasis.  A bad vaginal smell. The smell is fishy with bacterial vaginosis.  Vaginal itching, pain, or swelling.  Sex that is painful.  Pain or burning when urinating. Sometimes there are no symptoms. How is this diagnosed? This condition is diagnosed based on your symptoms and medical history. A physical exam, including a pelvic exam, will also be done. You may also have other tests, including:  Tests to determine the pH level (acidity or alkalinity) of your vagina.  A whiff test, to assess the odor that results when a sample of your vaginal discharge is mixed with a potassium hydroxide solution.  Tests of vaginal fluid. A sample will be examined under a microscope. How is this treated? Treatment varies depending on the type of vaginitis you have.  Your treatment may include:  Antibiotic creams or pills to treat bacterial vaginosis and trichomoniasis.  Antifungal medicines, such as vaginal creams or suppositories, to treat a yeast infection.  Medicine to ease discomfort if you have  viral vaginitis. Your sexual partner should also be treated.  Estrogen delivered in a cream, pill, suppository, or vaginal ring to treat atrophic vaginitis. If vaginal dryness occurs, lubricants and moisturizing creams may help. You may need to avoid scented soaps, sprays, or douches.  Stopping use of a product that is causing allergic vaginitis. Then using a vaginal cream to treat the symptoms. Follow these instructions at home: Lifestyle  Keep your genital area clean and dry. Avoid soap, and only rinse the area with water.  Do not douche or use tampons until your health care provider says it is okay to do so. Use sanitary pads, if needed.  Do not have sex until your health care provider approves. When you can return to sex, practice safe sex and use condoms.  Wipe from front to back. This avoids the spread of bacteria from the rectum to the vagina. General instructions  Take over-the-counter and prescription medicines only as told by your health care provider.  If you were prescribed an antibiotic medicine, take or use it as told by your health care provider. Do not stop taking or using the antibiotic even if you start to feel better.  Keep all follow-up visits as told by your health care provider. This is important. How is this prevented?  Use mild, non-scented products. Do not use things that can irritate the vagina, such as fabric softeners. Avoid the following products if they are scented: ? Feminine sprays. ? Detergents. ? Tampons. ? Feminine hygiene products. ? Soaps or bubble baths.  Let air reach your genital area. ? Wear cotton underwear to reduce moisture buildup. ? Avoid wearing underwear while you sleep. ? Avoid wearing tight pants and underwear or nylons without a cotton panel. ? Avoid wearing thong underwear.  Take off any wet clothing, such as bathing suits, as soon as possible.  Practice safe sex and use condoms. Contact a health care provider if:  You have  abdominal pain.  You have a fever.  You have symptoms that last for more than 2-3 days. Get help right away if:  You have a fever and your symptoms suddenly get worse. Summary  Vaginitis is a condition in which the vaginal tissue becomes inflamed.This condition is most often caused by a change in the normal balance of bacteria and yeast that live in the vagina.  Treatment varies depending on the type of vaginitis you have.  Do not douche, use tampons , or have sex until your health care provider approves. When you can return to sex, practice safe sex and use condoms. This information is not intended to replace advice given to you by your health care provider. Make sure you discuss any questions you have with your health care provider. Document Revised: 03/21/2017 Document Reviewed: 05/14/2016 Elsevier Patient Education  2020 [employer].  Postpartum Baby Blues The postpartum period begins right after the birth of a baby. During this time, there is often a lot of joy and excitement. It is also a time of many changes in the life of the parents. No matter how many times a mother gives birth, each child brings new challenges to the family, including different ways of relating to one another. It is common to have feelings of excitement along with confusing changes in  moods, emotions, and thoughts. You may feel happy one minute and sad or stressed the next. These feelings of sadness usually happen in the period right after you have your baby, and they go away within a week or two. This is called the "baby blues." What are the causes? There is no known cause of baby blues. It is likely caused by a combination of factors. However, changes in hormone levels after childbirth are believed to trigger some of the symptoms. Other factors that can play a role in these mood changes include:  Lack of sleep.  Stressful life events, such as poverty, caring for a loved one, or death of a loved  one.  Genetics. What are the signs or symptoms? Symptoms of this condition include:  Brief changes in mood, such as going from extreme happiness to sadness.  Decreased concentration.  Difficulty sleeping.  Crying spells and tearfulness.  Loss of appetite.  Irritability.  Anxiety. If the symptoms of baby blues last for more than 2 weeks or become more severe, you may have postpartum depression. How is this diagnosed? This condition is diagnosed based on an evaluation of your symptoms. There are no medical or lab tests that lead to a diagnosis, but there are various questionnaires that a health care provider may use to identify women with the baby blues or postpartum depression. How is this treated? Treatment is not needed for this condition. The baby blues usually go away on their own in 1-2 weeks. Social support is often all that is needed. You will be encouraged to get adequate sleep and rest. Follow these instructions at home: Lifestyle      Get as much rest as you can. Take a nap when the baby sleeps.  Exercise regularly as told by your health care provider. Some women find yoga and walking to be helpful.  Eat a balanced and nourishing diet. This includes plenty of fruits and vegetables, whole grains, and lean proteins.  Do little things that you enjoy. Have a cup of tea, take a bubble bath, read your favorite magazine, or listen to your favorite music.  Avoid alcohol.  Ask for help with household chores, cooking, grocery shopping, or running errands. Do not try to do everything yourself. Consider hiring a postpartum doula to help. This is a professional who specializes in providing support to new mothers.  Try not to make any major life changes during pregnancy or right after giving birth. This can add stress. General instructions  Talk to people close to you about how you are feeling. Get support from your partner, family members, friends, or other new moms. You may  want to join a support group.  Find ways to cope with stress. This may include: ? Writing your thoughts and feelings in a journal. ? Spending time outside. ? Spending time with people who make you laugh.  Try to stay positive in how you think. Think about the things you are grateful for.  Take over-the-counter and prescription medicines only as told by your health care provider.  Let your health care provider know if you have any concerns.  Keep all postpartum visits as told by your health care provider. This is important. Contact a health care provider if:  Your baby blues do not go away after 2 weeks. Get help right away if:  You have thoughts of taking your own life (suicidal thoughts).  You think you may harm the baby or other people.  You see or hear things that  are not there (hallucinations). Summary  After giving birth, you may feel happy one minute and sad or stressed the next. Feelings of sadness that happen right after the baby is born and go away after a week or two are called the "baby blues."  You can manage the baby blues by getting enough rest, eating a healthy diet, exercising, spending time with supportive people, and finding ways to cope with stress.  If feelings of sadness and stress last longer than 2 weeks or get in the way of caring for your baby, talk to your health care provider. This may mean you have postpartum depression. This information is not intended to replace advice given to you by your health care provider. Make sure you discuss any questions you have with your health care provider. Document Revised: 07/31/2018 Document Reviewed: 06/04/2016 Elsevier Patient Education  2020 [employer]  Managing Anxiety, Adult After being diagnosed with an anxiety disorder, you may be relieved to know why you have felt or behaved a certain way. You may also feel overwhelmed about the treatment ahead and what it will mean for your life. With care and support, you  can manage this condition and recover from it. How to manage lifestyle changes Managing stress and anxiety  Stress is your body's reaction to life changes and events, both good and bad. Most stress will last just a few hours, but stress can be ongoing and can lead to more than just stress. Although stress can play a major role in anxiety, it is not the same as anxiety. Stress is usually caused by something external, such as a deadline, test, or competition. Stress normally passes after the triggering event has ended.  Anxiety is caused by something internal, such as imagining a terrible outcome or worrying that something will go wrong that will devastate you. Anxiety often does not go away even after the triggering event is over, and it can become long-term (chronic) worry. It is important to understand the differences between stress and anxiety and to manage your stress effectively so that it does not lead to an anxious response. Talk with your health care provider or a counselor to learn more about reducing anxiety and stress. He or she may suggest tension reduction techniques, such as:  Music therapy. This can include creating or listening to music that you enjoy and that inspires you.  Mindfulness-based meditation. This involves being aware of your normal breaths while not trying to control your breathing. It can be done while sitting or walking.  Centering prayer. This involves focusing on a word, phrase, or sacred image that means something to you and brings you peace.  Deep breathing. To do this, expand your stomach and inhale slowly through your nose. Hold your breath for 3-5 seconds. Then exhale slowly, letting your stomach muscles relax.  Self-talk. This involves identifying thought patterns that lead to anxiety reactions and changing those patterns.  Muscle relaxation. This involves tensing muscles and then relaxing them. Choose a tension reduction technique that suits your lifestyle  and personality. These techniques take time and practice. Set aside 5-15 minutes a day to do them. Therapists can offer counseling and training in these techniques. The training to help with anxiety may be covered by some insurance plans. Other things you can do to manage stress and anxiety include:  Keeping a stress/anxiety diary. This can help you learn what triggers your reaction and then learn ways to manage your response.  Thinking about how you react to  certain situations. You may not be able to control everything, but you can control your response.  Making time for activities that help you relax and not feeling guilty about spending your time in this way.  Visual imagery and yoga can help you stay calm and relax.  Medicines Medicines can help ease symptoms. Medicines for anxiety include:  Anti-anxiety drugs.  Antidepressants. Medicines are often used as a primary treatment for anxiety disorder. Medicines will be prescribed by a health care provider. When used together, medicines, psychotherapy, and tension reduction techniques may be the most effective treatment. Relationships Relationships can play a big part in helping you recover. Try to spend more time connecting with trusted friends and family members. Consider going to couples counseling, taking family education classes, or going to family therapy. Therapy can help you and others better understand your condition. How to recognize changes in your anxiety Everyone responds differently to treatment for anxiety. Recovery from anxiety happens when symptoms decrease and stop interfering with your daily activities at home or work. This may mean that you will start to:  Have better concentration and focus. Worry will interfere less in your daily thinking.  Sleep better.  Be less irritable.  Have more energy.  Have improved memory. It is important to recognize when your condition is getting worse. Contact your health care provider  if your symptoms interfere with home or work and you feel like your condition is not improving. Follow these instructions at home: Activity  Exercise. Most adults should do the following: ? Exercise for at least 150 minutes each week. The exercise should increase your heart rate and make you sweat (moderate-intensity exercise). ? Strengthening exercises at least twice a week.  Get the right amount and quality of sleep. Most adults need 7-9 hours of sleep each night. Lifestyle   Eat a healthy diet that includes plenty of vegetables, fruits, whole grains, low-fat dairy products, and lean protein. Do not eat a lot of foods that are high in solid fats, added sugars, or salt.  Make choices that simplify your life.  Do not use any products that contain nicotine or tobacco, such as cigarettes, e-cigarettes, and chewing tobacco. If you need help quitting, ask your health care provider.  Avoid caffeine, alcohol, and certain over-the-counter cold medicines. These may make you feel worse. Ask your pharmacist which medicines to avoid. General instructions  Take over-the-counter and prescription medicines only as told by your health care provider.  Keep all follow-up visits as told by your health care provider. This is important. Where to find support You can get help and support from these sources:  Self-help groups.  Online and [employer].  A trusted spiritual leader.  Couples counseling.  Family education classes.  Family therapy. Where to find more information You may find that joining a support group helps you deal with your anxiety. The following sources can help you locate counselors or support groups near you:  Mental Health America: www.mentalhealthamerica.net  Anxiety and Depression Association of [place] (ADAA): ProgramCam.de  [employer]ness (NAMI): www.nami.org Contact a health care provider if you:  Have a hard time staying focused or  finishing daily tasks.  Spend many hours a day feeling worried about everyday life.  Become exhausted by worry.  Start to have headaches, feel tense, or have nausea.  Urinate more than normal.  Have diarrhea. Get help right away if you have:  A racing heart and shortness of breath.  Thoughts of hurting yourself  or others. If you ever feel like you may hurt yourself or others, or have thoughts about taking your own life, get help right away. You can go to your nearest emergency department or call:  Your local emergency services (911 in the [place]  A suicide crisis helpline, such as the National Suicide Prevention Lifeline at 386-097-01611-(929)285-9883. This is open 24 hours a day. Summary  Taking steps to learn and use tension reduction techniques can help calm you and help prevent triggering an anxiety reaction.  When used together, medicines, psychotherapy, and tension reduction techniques may be the most effective treatment.  Family, friends, and partners can play a big part in helping you recover from an anxiety disorder. This information is not intended to replace advice given to you by your health care provider. Make sure you discuss any questions you have with your health care provider. Document Revised: 09/08/2018 Document Reviewed: 09/08/2018 Elsevier Patient Education  2020 [employer]

## 2019-12-17 NOTE — Progress Notes (Signed)
New Patient Office Visit  Subjective:  Patient ID: Betty Gentry, female    DOB: 08-03-1985  Age: 34 y.o. MRN: [id number]  CC:  Chief Complaint  Patient presents with  . New Patient (Initial Visit)    establish care    HPI Betty Gentry is a 34 year old healthy female who presents to establish care with primary care provider.  She has concerns about fatigue, vaginitis, decreased appetite.  Fatigue: She has been feeling fatigued lately, decreased appetite.  She eats 1 good meal a day which is lunch.  She eats lightly for breakfast and supper.  She has no nausea, vomiting, abdominal pain diarrhea.  She has a tendency for mild constipation with a normal bowel movement every 2 days.  She will take Gas-X for flatus.  She has not tried a probiotic or regular fiber such as Citrucel.  Wt Readings from Last 3 Encounters:  12/17/19 127 lb (57.6 kg)  11/17/18 144 lb 4.8 oz (65.5 kg)  11/12/18 145 lb 4.8 oz (65.9 kg)     She thinks she has a vaginal yeast infection-noted vaginal redness, burning.  It hurts to wipe.  She had a creamy discharge, no odor.  Patient thinks this may be of yeast infection.  She did take amoxicillin last week for a root canal.  She has had no diarrhea. She is on oral birth control pills.  She did miss 2 days of pills.  Last menstrual period was August 1.  She has not missed her cycle.  No pelvic pain or cramps.  She denies risk of STI.  Patient is overdue for an annual Pap test.  She had a LEEP procedure for precancerous cervical changes 5 or 6 years ago.  She has had subsequent normal Pap smears since.  Anxiety/post partum depression : She has a 61-1/2-year-old daughter and was placed on Zoloft 25 mg depression.  She has a 59-year-old son born last year and her Zoloft dose was increased to 100 mg daily.  She has also been prescribed buspirone for anxiety.  She is prescribed buspirone 7.5 mg 3 times daily.  She takes 2 buspirone in the morning before work, one  afternoon, and then uses the Zoloft.  She still feels anxious, affect mild anxiety. GAD-7:8 and PHQ-9: 4. 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 She is agreeable to establishing care with  Behavioral Therapist.  She has been stressed recently because of the COVID-19.  She has had telephone conversations with sick patients and this upsets her.  Past Medical History:  Diagnosis Date  . History of chicken pox   . Vaginal delivery 03/18/2017  . Vaginal Pap smear, abnormal     Past Surgical History:  Procedure Laterality Date  . BELPHAROPTOSIS REPAIR    . KNEE SURGERY    . LEEP    . TONSILLECTOMY      Family History  Problem Relation Age of Onset  . Hypertension Father   . High Cholesterol Father   . Arthritis Father   . Hyperlipidemia Father   . Heart attack Paternal Grandmother   . Lung cancer Paternal Grandmother   . Arthritis Paternal Grandmother   . Cancer Paternal Grandmother   . Hyperlipidemia Paternal Grandmother   . Heart disease Paternal Grandmother   . Hypertension Paternal Grandmother   . Miscarriages / Stillbirths Paternal Grandmother   . Asthma Mother   . Arthritis Maternal Grandmother   . Heart  disease Maternal Grandmother   . Heart attack Maternal Grandmother   . Hyperlipidemia Maternal Grandmother   . Miscarriages / Stillbirths Maternal Grandmother   . Arthritis Maternal Grandfather   . Heart attack Maternal Grandfather   . Hypertension Maternal Grandfather   . Hyperlipidemia Maternal Grandfather   . Arthritis Paternal Grandfather   . Hyperlipidemia Paternal Grandfather     Social History   Socioeconomic History  . Marital status: Single    Spouse name: Not on file  . Number of children: Not on file  . Years of education: Not on file  . Highest education level: Associate degree: occupational, Scientist, product/process development, or vocational program  Occupational History  . Not on file  Tobacco Use  . Smoking status: Never  Smoker  . Smokeless tobacco: Never Used  Vaping Use  . Vaping Use: Never used  Substance and Sexual Activity  . Alcohol use: Yes  . Drug use: No  . Sexual activity: Yes    Birth control/protection: None    Comment: Couple of months  Other Topics Concern  . Not on file  Social History Narrative   New mama   Social Determinants of Health   Financial Resource Strain:   . Difficulty of Paying Living Expenses: Not on file  Food Insecurity:   . Worried About Programme researcher, broadcasting/film/video in the Last Year: Not on file  . Ran Out of Food in the Last Year: Not on file  Transportation Needs:   . Lack of Transportation (Medical): Not on file  . Lack of Transportation (Non-Medical): Not on file  Physical Activity:   . Days of Exercise per Week: Not on file  . Minutes of Exercise per Session: Not on file  Stress:   . Feeling of Stress : Not on file  Social Connections:   . Frequency of Communication with Friends and Family: Not on file  . Frequency of Social Gatherings with Friends and Family: Not on file  . Attends Religious Services: Not on file  . Active Member of Clubs or Organizations: Not on file  . Attends Banker Meetings: Not on file  . Marital Status: Not on file  Intimate Partner Violence:   . Fear of Current or Ex-Partner: Not on file  . Emotionally Abused: Not on file  . Physically Abused: Not on file  . Sexually Abused: Not on file    ROS Review of Systems  Constitutional: Positive for appetite change and fatigue. Negative for chills and fever.  HENT: Positive for congestion.   Eyes: Negative.   Respiratory: Negative.   Cardiovascular: Negative.   Gastrointestinal: Negative.   Endocrine: Negative.   Genitourinary: Positive for vaginal discharge and vaginal pain. Negative for difficulty urinating and frequency.  Musculoskeletal: Negative.   Skin: Negative.   Neurological: Negative.   Hematological: Negative.   Psychiatric/Behavioral: Negative.      Objective:   Today's Vitals: BP 106/62 (BP Location: Left Arm, Patient Position: Sitting, Cuff Size: Normal)   Pulse 80   Temp 98.4 F (36.9 C) (Oral)   Ht 5\' 3"  (1.6 m)   Wt 127 lb (57.6 kg)   SpO2 99%   BMI 22.50 kg/m   Physical Exam Exam conducted with a chaperone present.  Constitutional:      Appearance: She is normal weight.  HENT:     Head: Normocephalic.  Eyes:     Conjunctiva/sclera: Conjunctivae normal.     Pupils: Pupils are equal, round, and reactive to light.  Cardiovascular:  Rate and Rhythm: Normal rate and regular rhythm.     Pulses: Normal pulses.     Heart sounds: Normal heart sounds.  Pulmonary:     Breath sounds: Normal breath sounds.  Abdominal:     Palpations: Abdomen is soft.     Tenderness: There is no abdominal tenderness. There is no guarding.  Genitourinary:    General: Normal vulva.     Labia:        Right: No rash.        Left: No rash.      Vagina: Vaginal discharge present.     Cervix: Friability and eversion present.     Uterus: Normal. Not enlarged and not tender.      Adnexa: Right adnexa normal and left adnexa normal.     Rectum: Normal.  Musculoskeletal:     Cervical back: Normal range of motion.  Neurological:     Mental Status: She is alert.     Assessment & Plan:   Problem List Items Addressed This Visit      Genitourinary   Acute vaginitis - Primary   Relevant Orders   Cytology - PAP( [facility])   POCT urine pregnancy (Completed)   Cervicovaginal ancillary only( [facility])     Other   Anxiety   Relevant Medications   busPIRone (BUSPAR) 7.5 MG tablet   Other Relevant Orders   Ambulatory referral to Psychiatry   Fatigue   Relevant Orders   CBC with Differential/Platelet   TSH   Comprehensive metabolic panel   Hemoglobin A1c   VITAMIN D 25 Hydroxy (Vit-D Deficiency, Fractures)   B12 and Folate Panel   Encounter for medical examination to establish care   Relevant Orders   Lipid panel    Other  Visit Diagnoses    Encounter for HCV screening test for low risk patient       Relevant Orders   Hepatitis C antibody   Screening for HIV (human immunodeficiency virus)       Relevant Orders   HIV Antibody (routine testing w rflx)   Need for immunization against influenza       Relevant Orders   Flu Vaccine QUAD 36+ mos IM (Completed)   Postpartum depression       Relevant Medications   busPIRone (BUSPAR) 7.5 MG tablet   Other Relevant Orders   Ambulatory referral to Psychiatry      Outpatient Encounter Medications as of 12/17/2019  Medication Sig  . busPIRone (BUSPAR) 7.5 MG tablet Take 1 tablet (7.5 mg total) by mouth in the morning, at noon, in the evening, and at bedtime.  . diphenhydrAMINE (BENADRYL) 25 MG tablet Take 25 mg by mouth every 6 (six) hours as needed.  . methocarbamol (ROBAXIN) 750 MG tablet methocarbamol 750 mg tablet  . sertraline (ZOLOFT) 100 MG tablet Take 1 tablet (100 mg total) by mouth daily.  . SPRINTEC 28 0.25-35 MG-MCG tablet Take 1 tablet by mouth daily.  . [DISCONTINUED] busPIRone (BUSPAR) 7.5 MG tablet Take 1 tablet (7.5 mg total) by mouth 3 (three) times daily.  . fluconazole (DIFLUCAN) 150 MG tablet Take 1 tablet (150 mg total) by mouth once for 1 dose.  . [DISCONTINUED] ibuprofen (ADVIL) 600 MG tablet Take 1 tablet (600 mg total) by mouth every 6 (six) hours.   No facility-administered encounter medications on file as of 12/17/2019.   One stat dose Diflucan. Await bacterial swab to also r/o BV.  Referral into Dr. Maryruth BunKapur in psychiatry to help  with medication management and to follow-up on your postpartum depression. Increase your buspirone to 7.5 mg 4 times daily.  She can continue to take 2 tablets in the morning, one at lunch, and now we will add 1 in the evening.  She can take the Zoloft at bedtime.  We will see if this helps your anxiety.  Follow-up office visit in 1 month to go results, and complete your physical exam.   A total of 60 minutes of  time was spent on patient care obtaining history and physical, performing examination, counseling regarding bacterial vaginosis, anxiety depression, medication management, reviewing labs, coordination of care, and dictation.    Follow-up: Return in about 4 weeks (around 01/14/2020).  This visit occurred during the SARS-CoV-2 public health emergency.  Safety protocols were in place, including screening questions prior to the visit, additional usage of staff PPE, and extensive cleaning of exam room while observing appropriate contact time as indicated for disinfecting solutions.   Amedeo Kinsman, NP

## 2019-12-19 ENCOUNTER — Encounter: Payer: Self-pay | Admitting: Nurse Practitioner

## 2019-12-19 NOTE — Assessment & Plan Note (Signed)
Vaginal culture and PAP pending. Diflucan 150 mg stat dose s/p antibiotic use.

## 2019-12-20 ENCOUNTER — Other Ambulatory Visit: Payer: Self-pay | Admitting: Nurse Practitioner

## 2019-12-20 ENCOUNTER — Encounter: Payer: Self-pay | Admitting: Nurse Practitioner

## 2019-12-20 LAB — HEPATITIS C ANTIBODY
Hepatitis C Ab: NONREACTIVE
SIGNAL TO CUT-OFF: 0.02 (ref <1.00)

## 2019-12-20 LAB — CERVICOVAGINAL ANCILLARY ONLY
Bacterial Vaginitis (gardnerella): NEGATIVE
Candida Glabrata: NEGATIVE
Candida Vaginitis: POSITIVE — AB
Chlamydia: NEGATIVE
Comment: NEGATIVE
Comment: NEGATIVE
Comment: NEGATIVE
Comment: NEGATIVE
Comment: NEGATIVE
Comment: NORMAL
Neisseria Gonorrhea: NEGATIVE
Trichomonas: NEGATIVE

## 2019-12-20 LAB — HIV ANTIBODY (ROUTINE TESTING W REFLEX): HIV 1&2 Ab, 4th Generation: NONREACTIVE

## 2019-12-20 LAB — CYTOLOGY - PAP
Comment: NEGATIVE
Diagnosis: NEGATIVE
High risk HPV: NEGATIVE

## 2019-12-20 MED ORDER — FLUCONAZOLE 150 MG PO TABS: 150.0000 mg | ORAL_TABLET | Freq: Once | ORAL | 0 refills | Status: AC

## 2019-12-20 NOTE — Telephone Encounter (Signed)
Her swab showed Candida- so yes a yeast infection.I ordered a dose of Diflucan . If she took that, she may repeat another dose now.  She can also use Monistat vaginal cream- OTC.  No improvement, she needs GYN referral.

## 2020-01-27 ENCOUNTER — Telehealth: Payer: Self-pay

## 2020-01-27 NOTE — Telephone Encounter (Signed)
Pt wants refill on SPRINTEC 28 0.25-35 MG-MCG tablet. Pt states that she just had a pap smear with PCP in this office and does not want to have to go to OBGYN for another pap to get this medication filled.

## 2020-01-28 ENCOUNTER — Telehealth: Payer: Self-pay | Admitting: Nurse Practitioner

## 2020-01-28 ENCOUNTER — Encounter: Payer: Self-pay | Admitting: Nurse Practitioner

## 2020-01-28 MED ORDER — SPRINTEC 28 0.25-35 MG-MCG PO TABS: 1.0000 | ORAL_TABLET | Freq: Every day | ORAL | 1 refills | Status: DC

## 2020-01-28 NOTE — Telephone Encounter (Signed)
Patient called in for refill for SPRINTEC 28 0.25-35 MG-MCG tablet

## 2020-01-28 NOTE — Telephone Encounter (Addendum)
I had LMOM for her to call the office:   1. Please let Dayona know that I can refill her birth control. But she can get pregnant if she missed just one day of taking her pill.   Has she had sex since she has not been taking her birth control pill?   If yes,  Then I need to do a pregnancy test- first- before prescribing.   2. Also: Got a call from Dr. Elna Breslow office that she does not take her insurance.   Please have patient check with their insurance company to find out who is in network.   Try the following :  [facility] [phone]  [facility] [phone]  [facility] [phone]  3. How is Nakaya doing on her anxiety/depression meds? What is she taking now?

## 2020-01-29 NOTE — Telephone Encounter (Signed)
I LMOM for her to call the office. See other thread. She needs a pregnancy test and if negative, I can refill her birth control pills.

## 2020-02-01 NOTE — Telephone Encounter (Signed)
lmtcb

## 2020-02-04 NOTE — Telephone Encounter (Signed)
LMTCB; also sent patient a letter to call the office. A mychart message was also already sent to patient but she has not read it. There was a refill that was already sent in on 01/28/20 on BC pills.

## 2020-02-23 DIAGNOSIS — G894 Chronic pain syndrome: Secondary | ICD-10-CM | POA: Diagnosis not present

## 2020-02-23 DIAGNOSIS — M545 Low back pain, unspecified: Secondary | ICD-10-CM | POA: Diagnosis not present

## 2020-02-26 ENCOUNTER — Other Ambulatory Visit: Payer: Self-pay | Admitting: Family Medicine

## 2020-03-06 ENCOUNTER — Telehealth: Payer: Self-pay | Admitting: Nurse Practitioner

## 2020-03-06 ENCOUNTER — Telehealth: Payer: Self-pay

## 2020-03-06 MED ORDER — SERTRALINE HCL 100 MG PO TABS: 100.0000 mg | ORAL_TABLET | Freq: Every day | ORAL | 1 refills | Status: DC

## 2020-03-06 NOTE — Telephone Encounter (Signed)
..   LAST APPOINTMENT DATE: 02/26/2020   NEXT APPOINTMENT DATE:@Visit  date not found  MEDICATION:sertraline (ZOLOFT) 100 MG tablet

## 2020-03-06 NOTE — Telephone Encounter (Signed)
Betty Gentry lost her Medicaid and has no health insurance.   PLAN: I spoke with her and is doing well enough on Zoloft 100 mg and Buspar 7.5 mg - takes 2 in the am and 1 tab at lunch and rarely needs eve dose. She has had  2 panic attacks since Aug- quick and related to work stress.   I will refill her Zoloft and  send her information about [facility] which is for people without health insurance. Please contact them for instructions on how to access their healthcare.   Address: [address] [place], [place], [place] [address] Hours:  Closed ? Opens 8AM Tue Phone: [phone] Check insurance inf  Also: Please make an appointment with the psychiatry and here are 3 places to call.    [facility] :  [phone]  [facility]:  [phone]  [facility]: [phone]

## 2020-03-06 NOTE — Telephone Encounter (Signed)
I have spoken with patient.    Informed her that she will need to get this refill from current PCP.

## 2020-03-06 NOTE — Telephone Encounter (Signed)
Patient's last OV 12/17/19 and will have appt with someone 04/18/20 when they reschedule her with someone. Please advise on refill

## 2020-03-06 NOTE — Telephone Encounter (Signed)
Patient called in wanted a refiil for sertraline (ZOLOFT) 100 MG tablet

## 2020-03-06 NOTE — Telephone Encounter (Signed)
Patient is requesting refill on Zoloft.   Patient states she called over to [facility] to request refill of Zoloft from Fransico Setters NP.  States the individual she spoke with informed her that she had to reach out to our office here at [facility] to get this refill sent in, even though patient has transferred care over to [facility].    Patient will be out of this medication this afternoon and will miss a dose tonight.   Patient is requesting follow up call in regard to if she will need to change her dose once the script is sent in to cover the dosage she has missed tonight.

## 2020-03-20 ENCOUNTER — Encounter: Payer: Self-pay | Admitting: Nurse Practitioner

## 2020-03-21 MED ORDER — SPRINTEC 28 0.25-35 MG-MCG PO TABS: 1.0000 | ORAL_TABLET | Freq: Every day | ORAL | 3 refills | Status: DC

## 2020-03-21 NOTE — Telephone Encounter (Signed)
Patient needs refill on her BC pills. She can not see GYN due to losing her insurance. Patient ran out of her pills yesterday. Please advise on refills.

## 2020-04-17 ENCOUNTER — Encounter: Payer: Medicaid Other | Admitting: Nurse Practitioner

## 2020-04-18 ENCOUNTER — Encounter: Payer: Medicaid Other | Admitting: Nurse Practitioner

## 2020-05-06 ENCOUNTER — Telehealth: Payer: Medicaid Other | Admitting: Nurse Practitioner

## 2020-05-06 DIAGNOSIS — R059 Cough, unspecified: Secondary | ICD-10-CM | POA: Diagnosis not present

## 2020-05-06 MED ORDER — BENZONATATE 100 MG PO CAPS: 100.0000 mg | ORAL_CAPSULE | Freq: Three times a day (TID) | ORAL | 0 refills | Status: DC | PRN

## 2020-05-06 NOTE — Progress Notes (Signed)

## 2020-05-13 ENCOUNTER — Encounter: Payer: Self-pay | Admitting: Family Medicine

## 2020-05-13 ENCOUNTER — Telehealth: Payer: Medicaid Other | Admitting: Family Medicine

## 2020-05-13 DIAGNOSIS — J029 Acute pharyngitis, unspecified: Secondary | ICD-10-CM

## 2020-05-13 DIAGNOSIS — J0140 Acute pansinusitis, unspecified: Secondary | ICD-10-CM

## 2020-05-13 DIAGNOSIS — Z20822 Contact with and (suspected) exposure to covid-19: Secondary | ICD-10-CM | POA: Diagnosis not present

## 2020-05-13 DIAGNOSIS — R0981 Nasal congestion: Secondary | ICD-10-CM

## 2020-05-13 DIAGNOSIS — R0982 Postnasal drip: Secondary | ICD-10-CM

## 2020-05-13 DIAGNOSIS — J3489 Other specified disorders of nose and nasal sinuses: Secondary | ICD-10-CM

## 2020-05-13 MED ORDER — AZITHROMYCIN 250 MG PO TABS: ORAL_TABLET | ORAL | 0 refills | Status: DC

## 2020-05-13 NOTE — Progress Notes (Signed)
   Subjective:  Documentation for virtual audio and video telecommunications through Caregility encounter:  The patient was located at home. 2 patient identifiers used.  The provider was located in home office. The patient did consent to this visit and is aware of possible charges through their insurance for this visit.  The other persons participating in this telemedicine service were none. Time spent on call was 14 minutes and in review of previous records 20 minutes total.  This virtual service is not related to other E/M service within previous 7 days.   Patient ID: Betty Gentry, female    DOB: 1985/12/05, 35 y.o.   MRN: [id number]  HPI Chief Complaint  Patient presents with  . Sore Throat    URI Sx x more than one week. Children with Covid   Complains of more than one week hx of body aches, nasal congestion, rhinorrhea, frontal headache, sinus pressure, post nasal drainage and sore throat.  States she was Covid negative after her symptoms started last week but since then her children have tested positive.  Denies fever, chills, chest pain, palpitations, shortness of breath, abdominal pain, nausea, vomiting or diarrhea.  States she has a history of sinusitis and this feels like one of her sinus infections. States she has taken azithromycin more than once for her sinus infections and has done well even though she has an allergy listed to erythromycin.  States coughing has improved.   States her husband has looked in her throat and states it is red but no swelling or white patches.   States she has been taking Alka Seltzer, Delsym, Mucinex, Tessalon and Tylenol.  States she has not been getting much relief at all.  States she received all 3 Covid vaccines.  She works at a pharmacy     Review of Systems Pertinent positives and negatives in the history of present illness.     Objective:   Physical Exam There were no vitals taken for this visit.  Alert and oriented  and in no acute distress.  Respirations unlabored.  Reports sinus pressure and tenderness when she palpates her frontal sinuses.  Speaking in complete sentences without difficulty.  Normal speech, mood and thought process.      Assessment & Plan:  Acute pansinusitis, recurrence not specified - Plan: azithromycin (ZITHROMAX) 250 MG tablet  Acute pharyngitis, unspecified etiology - Plan: azithromycin (ZITHROMAX) 250 MG tablet  Close exposure to COVID-19 virus  Post-nasal drainage  Nasal congestion with rhinorrhea  Discussed that she may have had a false negative test over a week ago when she first became symptomatic, specially now that her children are positive for COVID. Her course of illness suggests she may have a bacterial sinusitis.  I will treat her as such.  She has done well with azithromycin in the past for her sinus infections.  I will send in the prescription for this. We also discussed supportive care including hydration, vitamins, continue with Mucinex and Tylenol.  I also recommend that she try a steroid nasal spray over-the-counter.  Continue with Chloraseptic and add salt water gargles.  She will follow-up with her PCP if worsening or not back to baseline after completing the antibiotic. She and her employer will determine when she is safe to return to work as she has already been in touch with them regarding her children being positive for COVID.

## 2020-08-24 ENCOUNTER — Other Ambulatory Visit: Payer: Self-pay | Admitting: Nurse Practitioner

## 2020-08-24 DIAGNOSIS — F419 Anxiety disorder, unspecified: Secondary | ICD-10-CM

## 2020-08-24 MED ORDER — SERTRALINE HCL 100 MG PO TABS: 100.0000 mg | ORAL_TABLET | Freq: Every day | ORAL | 0 refills | Status: DC

## 2020-08-24 NOTE — Telephone Encounter (Signed)
PT was inform that she must keep appointment to get refills. PT is running low on her sertraline (ZOLOFT) 100 MG tablet and states she has a week left. She also states she starts her birth control this weekend. PT is requesting refills on this.

## 2020-08-24 NOTE — Telephone Encounter (Signed)
Patient has an appointment to establish with Fl[facility]um NP on 09/11/20 ok to fill Zoloft 100 mg until appt?

## 2020-08-24 NOTE — Assessment & Plan Note (Signed)
Has been taking zoloft since 2020 . Refills given until patient can be seen by NP Flinchum

## 2020-08-24 NOTE — Telephone Encounter (Signed)
90 day refill sent to [facility]

## 2020-08-24 NOTE — Telephone Encounter (Signed)
PT also called back to see if she could get her birth control as well the SPRINTEC 28 0.25-35 MG-MCG tablet as she starts its Sunday.

## 2020-08-25 NOTE — Telephone Encounter (Signed)
      Patient has an appointment to establish with Fl[facility]um NP on 09/11/20 ok to fill Sprintec until appointment Dr. Darrick Huntsman filled her Zoloft on 08/24/20 did not know from message patient also needed her birthcontrol last PAP was 8/21.

## 2020-08-25 NOTE — Addendum Note (Signed)
Addended by: Dennie Bible on: 08/25/2020 08:27 AM   Modules accepted: Orders

## 2020-08-25 NOTE — Telephone Encounter (Signed)
Please confirm when her last menstrual cycle was.  Has she missed any doses of the contraceptive pill?

## 2020-08-29 MED ORDER — SPRINTEC 28 0.25-35 MG-MCG PO TABS: 1.0000 | ORAL_TABLET | Freq: Every day | ORAL | 0 refills | Status: DC

## 2020-08-29 NOTE — Telephone Encounter (Signed)
5/2-5/7 normal cycle last week dates shown,  She stated she had pills to take and has not missed any in several months but 4 to 5 months ago shecmissed someday's and is howcshe has had enough medication to last until now.  Patient stated she has not any missed cycles and has been consistently taking her pills the last 5 months.

## 2020-08-29 NOTE — Telephone Encounter (Signed)
Noted. Sent to pharmacy. She needs to keep her upcoming appointment.

## 2020-09-11 ENCOUNTER — Ambulatory Visit (INDEPENDENT_AMBULATORY_CARE_PROVIDER_SITE_OTHER): Payer: Medicaid Other | Admitting: Adult Health

## 2020-09-11 DIAGNOSIS — Z5329 Procedure and treatment not carried out because of patient's decision for other reasons: Secondary | ICD-10-CM

## 2020-09-11 NOTE — Progress Notes (Signed)
No show

## 2020-09-19 ENCOUNTER — Encounter: Payer: Self-pay | Admitting: Physician Assistant

## 2020-09-19 ENCOUNTER — Telehealth: Payer: Medicaid Other | Admitting: Physician Assistant

## 2020-09-19 DIAGNOSIS — J019 Acute sinusitis, unspecified: Secondary | ICD-10-CM

## 2020-09-19 MED ORDER — AZITHROMYCIN 250 MG PO TABS: ORAL_TABLET | ORAL | 0 refills | Status: DC

## 2020-09-19 MED ORDER — BENZONATATE 100 MG PO CAPS: 100.0000 mg | ORAL_CAPSULE | Freq: Three times a day (TID) | ORAL | 0 refills | Status: DC | PRN

## 2020-09-19 NOTE — Progress Notes (Signed)
Ms. brie, Gentry are scheduled for a virtual visit with your provider today.    Just as we do with appointments in the office, we must obtain your consent to participate.  Your consent will be active for this visit and any virtual visit you may have with one of our providers in the next 365 days.    If you have a MyChart account, I can also send a copy of this consent to you electronically.  All virtual visits are billed to your insurance company just like a traditional visit in the office.  As this is a virtual visit, video technology does not allow for your provider to perform a traditional examination.  This may limit your provider's ability to fully assess your condition.  If your provider identifies any concerns that need to be evaluated in person or the need to arrange testing such as labs, EKG, etc, we will make arrangements to do so.    Although advances in technology are sophisticated, we cannot ensure that it will always work on either your end or our end.  If the connection with a video visit is poor, we may have to switch to a telephone visit.  With either a video or telephone visit, we are not always able to ensure that we have a secure connection.   I need to obtain your verbal consent now.   Are you willing to proceed with your visit today?   Betty Gentry has provided verbal consent on 09/19/2020 for a virtual visit (video or telephone).   Dierdre Forth, PA-C 09/19/2020  8:07 AM   Date:  09/19/2020   ID:  Betty Gentry, DOB 1986/02/09, MRN [id number]  Patient Location: Home Provider Location: Home Office   Participants: Patient and Provider for Visit and Wrap up  Method of visit: Video  Location of Patient: Home Location of Provider: Home Office Consent was obtain for visit over the video. Services rendered by provider: Visit was performed via video  A video enabled telemedicine application was used and I verified that I am speaking with the correct  person using two identifiers.  PCP:  Theadore Nan, NP   Chief Complaint:  Nasal congestion  History of Present Illness:    Betty Gentry is a 35 y.o. female with history as stated below. Presents video telehealth for an acute care visit  Onset of symptoms was 1 week ago and symptoms have been persistent and include: nasal congestion with green discharge, headache, sore throat, post nasal drip.  Pt reports she felt better several days ago and then then symptoms worsened.        Denies having fevers, chills, shortness of breath, chest pain, ear pain.  Pt reports both children and her husband had a similar symptoms.    Modifying factors include: Dayquil and nyquil  No other aggravating or relieving factors.  No other c/o.  The patient does have symptoms concerning for COVID-19 infection (fever, chills, cough, or new shortness of breath).  Patient has been tested for COVID during this illness - negative on rapid home test.  Past Medical, Surgical, Social History, Allergies, and Medications have been Reviewed.  Patient Active Problem List   Diagnosis Date Noted  . Acute vaginitis 12/17/2019  . Fatigue 12/17/2019  . Encounter for medical examination to establish care 12/17/2019  . Term pregnancy 11/17/2018  . Constipation 12/10/2017  . Anxiety 10/17/2017  . Gastroesophageal reflux disease 10/17/2017  . Knee pain 10/07/2017    Social History  Tobacco Use  . Smoking status: Never Smoker  . Smokeless tobacco: Never Used  Substance Use Topics  . Alcohol use: Yes     Current Outpatient Medications:  .  azithromycin (ZITHROMAX) 250 MG tablet, Take 500mg  (2 tablets) on day 1 and 250mg  (1 tablet) once daily on days 2 to 5, Disp: 6 tablet, Rfl: 0 .  benzonatate (TESSALON PERLES) 100 MG capsule, Take 1 capsule (100 mg total) by mouth 3 (three) times daily as needed for cough (cough)., Disp: 20 capsule, Rfl: 0 .  busPIRone (BUSPAR) 7.5 MG tablet, Take 1 tablet (7.5 mg  total) by mouth in the morning, at noon, in the evening, and at bedtime., Disp: 360 tablet, Rfl: 3 .  diphenhydrAMINE (BENADRYL) 25 MG tablet, Take 25 mg by mouth every 6 (six) hours as needed., Disp: , Rfl:  .  methocarbamol (ROBAXIN) 750 MG tablet, methocarbamol 750 mg tablet, Disp: , Rfl:  .  sertraline (ZOLOFT) 100 MG tablet, Take 1 tablet (100 mg total) by mouth daily., Disp: 90 tablet, Rfl: 0 .  SPRINTEC 28 0.25-35 MG-MCG tablet, Take 1 tablet by mouth daily., Disp: 28 tablet, Rfl: 0   Allergies  Allergen Reactions  . Erythromycin Nausea Only  . Nsaids   . Sulfa Antibiotics Nausea Only  . Tetracyclines & Related Nausea Only     Review of Systems  Constitutional: Positive for chills and malaise/fatigue. Negative for fever.  HENT: Positive for congestion, sinus pain and sore throat. Negative for ear pain.   Eyes: Negative for blurred vision and double vision.  Respiratory: Positive for cough. Negative for shortness of breath and wheezing.   Cardiovascular: Negative for chest pain, palpitations and leg swelling.  Gastrointestinal: Negative for abdominal pain, diarrhea, nausea and vomiting.  Genitourinary: Negative for dysuria.  Musculoskeletal: Negative for myalgias.  Skin: Negative for rash.  Neurological: Positive for headaches. Negative for loss of consciousness and weakness.  Psychiatric/Behavioral: The patient is not nervous/anxious.    See HPI for history of present illness.  Physical Exam Constitutional:      General: She is not in acute distress.    Appearance: Normal appearance. She is not ill-appearing.  HENT:     Head: Normocephalic and atraumatic.     Nose: Congestion present.  Eyes:     Extraocular Movements: Extraocular movements intact.  Pulmonary:     Effort: Pulmonary effort is normal.     Comments: Dry cough Speaks in full sentences No accessory muscle usage Musculoskeletal:        General: Normal range of motion.     Cervical back: Normal range of  motion.  Skin:    Coloration: Skin is not pale.  Neurological:     General: No focal deficit present.     Mental Status: She is alert. Mental status is at baseline.  Psychiatric:        Mood and Affect: Mood normal.               A&P  1. Acute sinusitis, recurrence not specified, unspecified location  - continue home therapies  - will prescribe azithromycin and tessalon  - f/u with PCP as needed or if symptoms worsen    Patient voiced understanding and agreement to plan.   Time:   Today, I have spent 15 minutes with the patient with telehealth technology discussing the above problems, reviewing the chart, previous notes, medications and orders.    Tests Ordered: No orders of the defined types were placed in this encounter.  Medication Changes: Meds ordered this encounter  Medications  . azithromycin (ZITHROMAX) 250 MG tablet    Sig: Take 500mg  (2 tablets) on day 1 and 250mg  (1 tablet) once daily on days 2 to 5    Dispense:  6 tablet    Refill:  0  . benzonatate (TESSALON PERLES) 100 MG capsule    Sig: Take 1 capsule (100 mg total) by mouth 3 (three) times daily as needed for cough (cough).    Dispense:  20 capsule    Refill:  0     Disposition:  Follow up PCP as needed or if symptoms worsen  Signed, , PA-C  09/19/2020 8:13 AM

## 2020-09-19 NOTE — Patient Instructions (Addendum)
1. Acute sinusitis, recurrence not specified, unspecified location  - continue home therapies  - will prescribe azithromycin and tessalon  - f/u with PCP as needed or if symptoms worsen   Sinusitis, Adult Sinusitis is soreness and swelling (inflammation) of your sinuses. Sinuses are hollow spaces in the bones around your face. They are located:  Around your eyes.  In the middle of your forehead.  Behind your nose.  In your cheekbones. Your sinuses and nasal passages are lined with a fluid called mucus. Mucus drains out of your sinuses. Swelling can trap mucus in your sinuses. This lets germs (bacteria, virus, or fungus) grow, which leads to infection. Most of the time, this condition is caused by a virus. What are the causes? This condition is caused by:  Allergies.  Asthma.  Germs.  Things that block your nose or sinuses.  Growths in the nose (nasal polyps).  Chemicals or irritants in the air.  Fungus (rare). What increases the risk? You are more likely to develop this condition if:  You have a weak body defense system (immune system).  You do a lot of swimming or diving.  You use nasal sprays too much.  You smoke. What are the signs or symptoms? The main symptoms of this condition are pain and a feeling of pressure around the sinuses. Other symptoms include:  Stuffy nose (congestion).  Runny nose (drainage).  Swelling and warmth in the sinuses.  Headache.  Toothache.  A cough that may get worse at night.  Mucus that collects in the throat or the back of the nose (postnasal drip).  Being unable to smell and taste.  Being very tired (fatigue).  A fever.  Sore throat.  Bad breath. How is this diagnosed? This condition is diagnosed based on:  Your symptoms.  Your medical history.  A physical exam.  Tests to find out if your condition is short-term (acute) or long-term (chronic). Your doctor may: ? Check your nose for growths  (polyps). ? Check your sinuses using a tool that has a light (endoscope). ? Check for allergies or germs. ? Do imaging tests, such as an MRI or CT scan. How is this treated? Treatment for this condition depends on the cause and whether it is short-term or long-term.  If caused by a virus, your symptoms should go away on their own within 10 days. You may be given medicines to relieve symptoms. They include: ? Medicines that shrink swollen tissue in the nose. ? Medicines that treat allergies (antihistamines). ? A spray that treats swelling of the nostrils. ? Rinses that help get rid of thick mucus in your nose (nasal saline washes).  If caused by bacteria, your doctor may wait to see if you will get better without treatment. You may be given antibiotic medicine if you have: ? A very bad infection. ? A weak body defense system.  If caused by growths in the nose, you may need to have surgery. Follow these instructions at home: Medicines  Take, use, or apply over-the-counter and prescription medicines only as told by your doctor. These may include nasal sprays.  If you were prescribed an antibiotic medicine, take it as told by your doctor. Do not stop taking the antibiotic even if you start to feel better. Hydrate and humidify  Drink enough water to keep your pee (urine) pale yellow.  Use a cool mist humidifier to keep the humidity level in your home above 50%.  Breathe in steam for 10-15 minutes, 3-4 times  a day, or as told by your doctor. You can do this in the bathroom while a hot shower is running.  Try not to spend time in cool or dry air.   Rest  Rest as much as you can.  Sleep with your head raised (elevated).  Make sure you get enough sleep each night. General instructions  Put a warm, moist washcloth on your face 3-4 times a day, or as often as told by your doctor. This will help with discomfort.  Wash your hands often with soap and water. If there is no soap and  water, use hand sanitizer.  Do not smoke. Avoid being around people who are smoking (secondhand smoke).  Keep all follow-up visits as told by your doctor. This is important.   Contact a doctor if:  You have a fever.  Your symptoms get worse.  Your symptoms do not get better within 10 days. Get help right away if:  You have a very bad headache.  You cannot stop throwing up (vomiting).  You have very bad pain or swelling around your face or eyes.  You have trouble seeing.  You feel confused.  Your neck is stiff.  You have trouble breathing. Summary  Sinusitis is swelling of your sinuses. Sinuses are hollow spaces in the bones around your face.  This condition is caused by tissues in your nose that become inflamed or swollen. This traps germs. These can lead to infection.  If you were prescribed an antibiotic medicine, take it as told by your doctor. Do not stop taking it even if you start to feel better.  Keep all follow-up visits as told by your doctor. This is important. This information is not intended to replace advice given to you by your health care provider. Make sure you discuss any questions you have with your health care provider. Document Revised: 09/08/2017 Document Reviewed: 09/08/2017 Elsevier Patient Education  2021 [employer].

## 2020-09-20 ENCOUNTER — Ambulatory Visit
Admission: EM | Admit: 2020-09-20 | Discharge: 2020-09-20 | Disposition: A | Discharge status: Home / Self Care | Payer: Medicaid Other | Attending: Emergency Medicine | Admitting: Emergency Medicine

## 2020-09-20 ENCOUNTER — Other Ambulatory Visit: Payer: Self-pay

## 2020-09-20 DIAGNOSIS — J069 Acute upper respiratory infection, unspecified: Secondary | ICD-10-CM | POA: Diagnosis not present

## 2020-09-20 DIAGNOSIS — Z1152 Encounter for screening for COVID-19: Secondary | ICD-10-CM | POA: Diagnosis not present

## 2020-09-20 DIAGNOSIS — B349 Viral infection, unspecified: Secondary | ICD-10-CM

## 2020-09-20 LAB — POCT URINE PREGNANCY: Preg Test, Ur: NEGATIVE

## 2020-09-20 MED ORDER — PROMETHAZINE-DM 6.25-15 MG/5ML PO SYRP: 5.0000 mL | ORAL_SOLUTION | Freq: Every evening | ORAL | 0 refills | Status: DC | PRN

## 2020-09-20 NOTE — Discharge Instructions (Signed)
Use the promethazine-dextromethorphan cough syrup at bedtime as needed for cough.  Do not drive, operate machinery, or drink alcohol with this medication as it may cause drowsiness.  Your COVID and Influenza tests are pending.  You should self quarantine until the test results are back.    Take Tylenol or ibuprofen as needed for fever or discomfort.  Rest and keep yourself hydrated.    Follow-up with your primary care provider if your symptoms are not improving.

## 2020-09-20 NOTE — ED Provider Notes (Signed)
Betty Gentry    CSN: [id number] Arrival date & time: 09/20/20  1503      History   Chief Complaint Chief Complaint  Patient presents with  . Cough    HPI Betty Gentry is a 35 y.o. female.   Presents with 1 week history of headache, fatigue, cough, diarrhea.  She denies fever, rash, shortness of breath, vomiting, or other symptoms.  2 negative at-home COVID tests.  Patient had a video visit yesterday; diagnosed with acute sinusitis; treated with Zithromax and Tessalon; patient states she does not think that she has just a sinus infection and would like to be tested for flu and COVID.  Her medical history also includes GERD and anxiety.  >30 days since LMP.    The history is provided by the patient and medical records.    Past Medical History:  Diagnosis Date  . History of chicken pox   . Preterm labor 10/12/2018  . Uterine contractions during pregnancy 03/17/2017  . Vaginal delivery 03/18/2017  . Vaginal Pap smear, abnormal     Patient Active Problem List   Diagnosis Date Noted  . Acute vaginitis 12/17/2019  . Fatigue 12/17/2019  . Encounter for medical examination to establish care 12/17/2019  . Term pregnancy 11/17/2018  . Constipation 12/10/2017  . Anxiety 10/17/2017  . Gastroesophageal reflux disease 10/17/2017  . Knee pain 10/07/2017    Past Surgical History:  Procedure Laterality Date  . BELPHAROPTOSIS REPAIR    . KNEE SURGERY    . LEEP    . TONSILLECTOMY      OB History    Gravida  2   Para  2   Term  2   Preterm      AB      Living  2     SAB      IAB      Ectopic      Multiple  0   Live Births  2        Obstetric Comments  3rd degree tear/episitomy         Home Medications    Prior to Admission medications   Medication Sig Start Date End Date Taking? Authorizing Provider  promethazine-dextromethorphan (PROMETHAZINE-DM) 6.25-15 MG/5ML syrup Take 5 mLs by mouth at bedtime as needed for cough. 09/20/20  Yes Mickie Bail, NP  azithromycin (ZITHROMAX) 250 MG tablet Take 500mg  (2 tablets) on day 1 and 250mg  (1 tablet) once daily on days 2 to 5 09/19/20   Muthersbaugh, , PA-C  benzonatate (TESSALON PERLES) 100 MG capsule Take 1 capsule (100 mg total) by mouth 3 (three) times daily as needed for cough (cough). 09/19/20   Muthersbaugh, Dahlia Client, PA-C  busPIRone (BUSPAR) 7.5 MG tablet Take 1 tablet (7.5 mg total) by mouth in the morning, at noon, in the evening, and at bedtime. 12/17/19 12/11/20  12/19/19, NP  diphenhydrAMINE (BENADRYL) 25 MG tablet Take 25 mg by mouth every 6 (six) hours as needed.    [provider]  methocarbamol (ROBAXIN) 750 MG tablet methocarbamol 750 mg tablet    [provider]  sertraline (ZOLOFT) 100 MG tablet Take 1 tablet (100 mg total) by mouth daily. 08/24/20   Theadore Nan, MD  SPRINTEC 28 0.25-35 MG-MCG tablet Take 1 tablet by mouth daily. 08/29/20   01-06-1999, MD    Family History Family History  Problem Relation Age of Onset  . Hypertension Father   . High Cholesterol Father   .  Arthritis Father   . Hyperlipidemia Father   . Heart attack Paternal Grandmother   . Lung cancer Paternal Grandmother   . Arthritis Paternal Grandmother   . Cancer Paternal Grandmother   . Hyperlipidemia Paternal Grandmother   . Heart disease Paternal Grandmother   . Hypertension Paternal Grandmother   . Miscarriages / Stillbirths Paternal Grandmother   . Asthma Mother   . Arthritis Maternal Grandmother   . Heart disease Maternal Grandmother   . Heart attack Maternal Grandmother   . Hyperlipidemia Maternal Grandmother   . Miscarriages / Stillbirths Maternal Grandmother   . Arthritis Maternal Grandfather   . Heart attack Maternal Grandfather   . Hypertension Maternal Grandfather   . Hyperlipidemia Maternal Grandfather   . Arthritis Paternal Grandfather   . Hyperlipidemia Paternal Grandfather     Social History Social History   Tobacco Use  .  Smoking status: Never Smoker  . Smokeless tobacco: Never Used  Vaping Use  . Vaping Use: Never used  Substance Use Topics  . Alcohol use: Yes  . Drug use: No     Allergies   Erythromycin, Nsaids, Sulfa antibiotics, and Tetracyclines & related   Review of Systems Review of Systems  Constitutional: Positive for fatigue. Negative for chills and fever.  HENT: Negative for ear pain and sore throat.   Respiratory: Positive for cough. Negative for shortness of breath.   Cardiovascular: Negative for chest pain and palpitations.  Gastrointestinal: Positive for diarrhea. Negative for abdominal pain and vomiting.  Skin: Negative for color change and rash.  Neurological: Positive for headaches. Negative for syncope and weakness.  All other systems reviewed and are negative.    Physical Exam Triage Vital Signs ED Triage Vitals  Enc Vitals Group     BP      Pulse      Resp      Temp      Temp src      SpO2      Weight      Height      Head Circumference      Peak Flow      Pain Score      Pain Loc      Pain Edu?      Excl. in GC?    No data found.  Updated Vital Signs BP 117/70   Pulse (!) 111   Temp 98.7 F (37.1 C) (Oral)   Resp 16   Ht 5\' 1"  (1.549 m)   Wt 120 lb (54.4 kg)   LMP  (LMP Unknown)   SpO2 98%   BMI 22.67 kg/m   Visual Acuity Right Eye Distance:   Left Eye Distance:   Bilateral Distance:    Right Eye Near:   Left Eye Near:    Bilateral Near:     Physical Exam Vitals and nursing note reviewed.  Constitutional:      General: She is not in acute distress.    Appearance: She is well-developed.  HENT:     Head: Normocephalic and atraumatic.     Right Ear: Tympanic membrane normal.     Left Ear: Tympanic membrane normal.     Nose: Rhinorrhea present.     Mouth/Throat:     Mouth: Mucous membranes are moist.     Comments: Clear postnasal drip. Eyes:     Conjunctiva/sclera: Conjunctivae normal.  Cardiovascular:     Rate and Rhythm: Normal  rate and regular rhythm.     Heart sounds: Normal heart sounds.  Pulmonary:     Effort: Pulmonary effort is normal. No respiratory distress.     Breath sounds: Normal breath sounds.  Abdominal:     General: Bowel sounds are normal.     Palpations: Abdomen is soft.     Tenderness: There is no abdominal tenderness. There is no guarding or rebound.  Musculoskeletal:     Cervical back: Neck supple.  Skin:    General: Skin is warm and dry.     Findings: No rash.  Neurological:     General: No focal deficit present.     Mental Status: She is alert and oriented to person, place, and time.     Gait: Gait normal.  Psychiatric:        Mood and Affect: Mood normal.        Behavior: Behavior normal.      UC Treatments / Results  Labs (all labs ordered are listed, but only abnormal results are displayed) Labs Reviewed  NOVEL CORONAVIRUS, NAA  COVID-19, FLU A+B NAA  POCT URINE PREGNANCY    EKG   Radiology No results found.  Procedures Procedures (including critical care time)  Medications Ordered in UC Medications - No data to display  Initial Impression / Assessment and Plan / UC Course  I have reviewed the triage vital signs and the nursing notes.  Pertinent labs & imaging results that were available during my care of the patient were reviewed by me and considered in my medical decision making (see chart for details).   Viral illness.  Urine pregnancy negative.  Treating cough with promethazine-dextromethorphan syrup at bedtime; precautions for drowsiness with this medication discussed.  Influenza and COVID pending.  Instructed patient to self quarantine until the test results are back.  Discussed other symptomatic treatment including Tylenol or ibuprofen, rest, hydration.  Instructed patient to follow up with PCP if her symptoms are not improving.  Patient agrees to plan of care.    Final Clinical Impressions(s) / UC Diagnoses   Final diagnoses:  Encounter for screening  for COVID-19  Viral illness     Discharge Instructions     Use the promethazine-dextromethorphan cough syrup at bedtime as needed for cough.  Do not drive, operate machinery, or drink alcohol with this medication as it may cause drowsiness.  Your COVID and Influenza tests are pending.  You should self quarantine until the test results are back.    Take Tylenol or ibuprofen as needed for fever or discomfort.  Rest and keep yourself hydrated.    Follow-up with your primary care provider if your symptoms are not improving.        ED Prescriptions    Medication Sig Dispense Auth. Provider   promethazine-dextromethorphan (PROMETHAZINE-DM) 6.25-15 MG/5ML syrup Take 5 mLs by mouth at bedtime as needed for cough. 30 mL Mickie Bail, NP     I have reviewed the PDMP during this encounter.   Mickie Bail, NP 09/20/20 [phone]

## 2020-09-20 NOTE — ED Triage Notes (Signed)
Pt reports having productive cough, headache and diarrhea x1 week. 2 neg home covid test within last week.

## 2020-09-23 LAB — COVID-19, FLU A+B NAA
Influenza A, NAA: NOT DETECTED
Influenza B, NAA: NOT DETECTED
SARS-CoV-2, NAA: NOT DETECTED

## 2020-12-01 ENCOUNTER — Other Ambulatory Visit: Payer: Self-pay | Admitting: Internal Medicine

## 2020-12-01 NOTE — Telephone Encounter (Signed)
This patient has not established with another provider in our office yet. It looks like she has not been seen since August 2021. She needs to schedule a follow-up with Marcelino Duster then I will send in enough to cover until she is able to see her.

## 2020-12-01 NOTE — Telephone Encounter (Signed)
Mychart sent to inform patient. Which chart indicates that she has seen message.

## 2020-12-04 DIAGNOSIS — M5412 Radiculopathy, cervical region: Secondary | ICD-10-CM | POA: Diagnosis not present

## 2020-12-06 DIAGNOSIS — G894 Chronic pain syndrome: Secondary | ICD-10-CM | POA: Diagnosis not present

## 2020-12-06 DIAGNOSIS — M545 Low back pain, unspecified: Secondary | ICD-10-CM | POA: Diagnosis not present

## 2020-12-14 ENCOUNTER — Other Ambulatory Visit: Payer: Self-pay | Admitting: Internal Medicine

## 2020-12-15 NOTE — Telephone Encounter (Signed)
Refilled: 08/24/2020 Last OV: 12/16/2020 No showed with Marcelino Duster on 09/11/2020 Next OV: not scheduled

## 2020-12-15 NOTE — Telephone Encounter (Signed)
Please CALL the patient. She needs a visit scheduled to have this refilled. It has been a year since she has been seen in our office and she no showed a prior visit. I will send in enough to cover until her scheduled appointment once she has an appointment scheduled.

## 2021-01-02 NOTE — Telephone Encounter (Signed)
Patient switching practices .

## 2021-01-04 ENCOUNTER — Ambulatory Visit (INDEPENDENT_AMBULATORY_CARE_PROVIDER_SITE_OTHER): Payer: Medicaid Other | Admitting: Nurse Practitioner

## 2021-01-04 ENCOUNTER — Encounter: Payer: Self-pay | Admitting: Nurse Practitioner

## 2021-01-04 ENCOUNTER — Other Ambulatory Visit: Payer: Self-pay

## 2021-01-04 VITALS — BP 108/66 | HR 98 | Ht 62.0 in | Wt 117.6 lb

## 2021-01-04 DIAGNOSIS — M545 Low back pain, unspecified: Secondary | ICD-10-CM | POA: Diagnosis not present

## 2021-01-04 DIAGNOSIS — O99345 Other mental disorders complicating the puerperium: Secondary | ICD-10-CM | POA: Diagnosis not present

## 2021-01-04 DIAGNOSIS — G8929 Other chronic pain: Secondary | ICD-10-CM | POA: Diagnosis not present

## 2021-01-04 DIAGNOSIS — R634 Abnormal weight loss: Secondary | ICD-10-CM | POA: Insufficient documentation

## 2021-01-04 DIAGNOSIS — H9192 Unspecified hearing loss, left ear: Secondary | ICD-10-CM

## 2021-01-04 DIAGNOSIS — F53 Postpartum depression: Secondary | ICD-10-CM | POA: Insufficient documentation

## 2021-01-04 DIAGNOSIS — F419 Anxiety disorder, unspecified: Secondary | ICD-10-CM

## 2021-01-04 DIAGNOSIS — Z3041 Encounter for surveillance of contraceptive pills: Secondary | ICD-10-CM | POA: Diagnosis not present

## 2021-01-04 HISTORY — DX: Postpartum depression: F53.0

## 2021-01-04 HISTORY — DX: Unspecified hearing loss, left ear: H91.92

## 2021-01-04 MED ORDER — SPRINTEC 28 0.25-35 MG-MCG PO TABS: 1.0000 | ORAL_TABLET | Freq: Every day | ORAL | 2 refills | Status: DC

## 2021-01-04 MED ORDER — SERTRALINE HCL 100 MG PO TABS: 100.0000 mg | ORAL_TABLET | Freq: Every day | ORAL | 1 refills | Status: DC

## 2021-01-04 MED ORDER — BUSPIRONE HCL 7.5 MG PO TABS: 7.5000 mg | ORAL_TABLET | Freq: Four times a day (QID) | ORAL | 2 refills | Status: DC

## 2021-01-04 MED ORDER — HYDROXYZINE HCL 10 MG PO TABS: 10.0000 mg | ORAL_TABLET | Freq: Three times a day (TID) | ORAL | 1 refills | Status: DC | PRN

## 2021-01-04 NOTE — Assessment & Plan Note (Signed)
Chronic.  Stable with sertraline 100 mg daily.  Continue this medication.

## 2021-01-04 NOTE — Assessment & Plan Note (Signed)
Chronic. Will plan to start hydroxyzine 10mg  for night time/rescue medication during day if tolerable.  If she can tolerate this, we can start taking Buspar 15 mg daily in the morning and 15 mg daily in the evening.  Continue sertraline 100 mg daily.  Follow up in 1 month.

## 2021-01-04 NOTE — Assessment & Plan Note (Signed)
Refill given for Sprintec - she takes this regularly.  BP is not elevated today.  Refills given for 3 months.

## 2021-01-04 NOTE — Assessment & Plan Note (Signed)
Patient has lost ~10 lbs in the past 13 months.  She has not tried to lose weight.  Right now, we are suspecting this is likely stress related.  We are working on getting her stress level under control with pharmacotherapy.  Discussed high protein/fat meals frequently to help gain weight.  Follow up in 1 month and can consider blood work if unintentional weight loss continues at that time.

## 2021-01-04 NOTE — Progress Notes (Signed)
Subjective:    Patient ID: Betty Gentry, female    DOB: 04-07-1986, 35 y.o.   MRN: [id number]  HPI: Betty Gentry is a 35 y.o. female presenting for new patient visit to establish care.  Introduced to Publishing rights manager role and practice setting.  All questions answered.  Discussed provider/patient relationship and expectations.  Chief Complaint  Patient presents with   New Patient (Initial Visit)    New patient ,feeling like they are full , more on left side , sharp in left pain , weight loss  may be related to stress    ANXIETY Patient reports she has been "extra" stressed lately.  There has not been a specific trigger; "normal" life stressors seem to bother her more.  She notices she forgets to eat when this happens and is worried about her weight loss.  She is currently taking sertraline 100 mg daily, Buspar 7.5 mg 2 tablets at breakfast, one in the afternoon, and one in the evening.  Also takes Benadryl nightly to sleep. Mood status: worse Satisfied with current treatment?: no Symptom severity: moderate  Duration of current treatment : chronic Side effects: no Medication compliance: excellent compliance Psychotherapy/counseling: no Previous psychiatric medications: Lexapro, citalopram Depressed mood: no Anxious mood: yes Anhedonia: no Significant weight loss or gain: yes Insomnia: yes Fatigue: no  Depression screen [facility] 2/9 01/04/2021 12/17/2019 10/17/2017 10/07/2017  Decreased Interest 0 0 0 0  Down, Depressed, Hopeless 0 0 0 0  PHQ - 2 Score 0 0 0 0  Altered sleeping - 0 - -  Tired, decreased energy - 2 - -  Change in appetite - 1 - -  Feeling bad or failure about yourself  - 0 - -  Trouble concentrating - 1 - -  Moving slowly or fidgety/restless - 0 - -  Suicidal thoughts - 0 - -  PHQ-9 Score - 4 - -  Difficult doing work/chores - Somewhat difficult - -    Has chronic back pain - sees [facility] and takes Robaxin for pinched nerve in neck every day.     HEARING MUFFLED Reports when she was pregnant a couple of years ago, she thinks she had a ruptured ear drum.  Ever since that time, the hearing in her left ear has been muffled. Duration: years Involved ear(s): left Fever: no Otorrhea: no Upper respiratory infection symptoms: no Pruritus: no Hearing loss: yes Water immersion no Status: stable Treatments attempted: nothing   Allergies  Allergen Reactions   Erythromycin Nausea Only   Nsaids    Sulfa Antibiotics Nausea Only   Tetracyclines & Related Nausea Only    Outpatient Encounter Medications as of 01/04/2021  Medication Sig Note   b complex vitamins capsule Take 1 capsule by mouth daily.    hydrOXYzine (ATARAX/VISTARIL) 10 MG tablet Take 1 tablet (10 mg total) by mouth 3 (three) times daily as needed.    methocarbamol (ROBAXIN) 750 MG tablet methocarbamol 750 mg tablet    [DISCONTINUED] busPIRone (BUSPAR) 7.5 MG tablet Take 7.5 mg by mouth in the morning, at noon, in the evening, and at bedtime.    [DISCONTINUED] diphenhydrAMINE (BENADRYL) 25 MG tablet Take 25 mg by mouth every 6 (six) hours as needed. 01/04/2021: patient will not take if she is taking hydroxyzine   [DISCONTINUED] sertraline (ZOLOFT) 100 MG tablet Take 1 tablet (100 mg total) by mouth daily.    [DISCONTINUED] SPRINTEC 28 0.25-35 MG-MCG tablet Take 1 tablet by mouth daily.    busPIRone (  BUSPAR) 7.5 MG tablet Take 1 tablet (7.5 mg total) by mouth in the morning, at noon, in the evening, and at bedtime.    sertraline (ZOLOFT) 100 MG tablet Take 1 tablet (100 mg total) by mouth daily.    SPRINTEC 28 0.25-35 MG-MCG tablet Take 1 tablet by mouth daily.    [DISCONTINUED] azithromycin (ZITHROMAX) 250 MG tablet Take 500mg  (2 tablets) on day 1 and 250mg  (1 tablet) once daily on days 2 to 5    [DISCONTINUED] benzonatate (TESSALON PERLES) 100 MG capsule Take 1 capsule (100 mg total) by mouth 3 (three) times daily as needed for cough (cough).    [DISCONTINUED]  promethazine-dextromethorphan (PROMETHAZINE-DM) 6.25-15 MG/5ML syrup Take 5 mLs by mouth at bedtime as needed for cough.    No facility-administered encounter medications on file as of 01/04/2021.    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   Encounter for surveillance of contraceptive pills 01/04/2021   Weight loss, unintentional 01/04/2021   Resolved Ambulatory Problems    Diagnosis Date Noted   Uterine contractions during pregnancy 03/17/2017   Knee pain 10/07/2017   Gastroesophageal reflux disease 10/17/2017   Constipation 12/10/2017   Preterm labor 10/12/2018   Pregnant and not yet delivered in third trimester 10/13/2018   Term pregnancy 11/17/2018   Acute vaginitis 12/17/2019   Fatigue 12/17/2019   Encounter for medical examination to establish care 12/17/2019   Past Medical History:  Diagnosis Date   History of chicken pox    Vaginal delivery 03/18/2017   Vaginal Pap smear, abnormal     Past Medical History:  Diagnosis Date   Acute vaginitis 12/17/2019   Fatigue 12/17/2019   Gastroesophageal reflux disease 10/17/2017   History of chicken pox    Knee pain 10/07/2017   Preterm labor 10/12/2018   Term pregnancy 11/17/2018   Uterine contractions during pregnancy 03/17/2017   Vaginal delivery 03/18/2017   Vaginal Pap smear, abnormal     Past Surgical History:  Procedure Laterality Date   BELPHAROPTOSIS REPAIR     KNEE SURGERY Right    x3   LEEP     TONSILLECTOMY      Social History   Tobacco Use   Smoking status: Never   Smokeless tobacco: Never  Vaping Use   Vaping Use: Some days   Substances: CBD  Substance Use Topics   Alcohol use: Yes   Drug use: No    Family History  Problem Relation Age of Onset   Asthma Mother    Hypertension Father    High Cholesterol Father    Arthritis Father    Hyperlipidemia Father     Arthritis Maternal Grandmother    Heart disease Maternal Grandmother    Heart attack Maternal Grandmother    Hyperlipidemia Maternal Grandmother    Miscarriages / Stillbirths Maternal Grandmother    Osteoporosis Maternal Grandmother    Fibromyalgia Maternal Grandmother    Arthritis Maternal Grandfather    Heart attack Maternal Grandfather    Hypertension Maternal Grandfather    Hyperlipidemia Maternal Grandfather    Heart attack Paternal Grandmother    Lung cancer Paternal Grandmother    Arthritis Paternal Grandmother    Cancer Paternal Grandmother    Hyperlipidemia Paternal Grandmother    Heart disease Paternal Grandmother    Hypertension Paternal Grandmother    Miscarriages / Stillbirths Paternal Grandmother  Arthritis Paternal Grandfather    Hyperlipidemia Paternal Grandfather     Review of Systems Per HPI unless specifically indicated above     Objective:    BP 108/66   Pulse 98   Ht 5\' 2"  (1.575 m)   Wt 117 lb 9.6 oz (53.3 kg)   LMP 12/17/2020   SpO2 98%   BMI 21.51 kg/m   Wt Readings from Last 3 Encounters:  01/04/21 117 lb 9.6 oz (53.3 kg)  09/20/20 120 lb (54.4 kg)  12/17/19 127 lb (57.6 kg)    Physical Exam Vitals and nursing note reviewed.  Constitutional:      General: She is not in acute distress.    Appearance: Normal appearance. She is not toxic-appearing.  HENT:     Right Ear: Tympanic membrane, ear canal and external ear normal. There is no impacted cerumen. Tympanic membrane is not perforated or erythematous.     Left Ear: No drainage, swelling or tenderness.  No middle ear effusion. There is no impacted cerumen. Tympanic membrane is scarred. Tympanic membrane is not perforated or erythematous.  Skin:    General: Skin is warm and dry.     Coloration: Skin is not jaundiced or pale.     Findings: No erythema.  Neurological:     Mental Status: She is alert and oriented to person, place, and time.     Motor: No weakness.     Gait: Gait  normal.  Psychiatric:        Mood and Affect: Mood normal.        Behavior: Behavior normal.        Thought Content: Thought content normal.        Judgment: Judgment normal.      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  Will place referral to ENT for hearing test and for evaluation per patient request.      Relevant Orders   Ambulatory referral to ENT     Other   Weight loss, unintentional    Patient has lost ~10 lbs in the past 13 months.  She has not tried to lose weight.  Right now, we are suspecting this is likely stress related.  We are working on getting her stress level under control with pharmacotherapy.  Discussed high protein/fat meals frequently to help gain weight.  Follow up in 1 month and can consider blood work if unintentional weight loss continues at that time.      Postpartum depression - Primary    Chronic.  Stable with sertraline 100 mg daily.  Continue this medication.      Relevant Medications   hydrOXYzine (ATARAX/VISTARIL) 10 MG tablet   busPIRone (BUSPAR) 7.5 MG tablet   sertraline (ZOLOFT) 100 MG tablet   Low back pain    Chronic.  Follows with [facility] - continue collaboration      Encounter for surveillance of contraceptive pills    Refill given for Sprintec - she takes this regularly.  BP is not elevated today.  Refills given for 3 months.       Relevant Medications   SPRINTEC 28 0.25-35 MG-MCG tablet   Anxiety    Chronic. Will plan to start hydroxyzine 10mg  for night time/rescue medication during day if tolerable.  If she can tolerate this, we can start taking Buspar 15 mg daily in  the morning and 15 mg daily in the evening.  Continue sertraline 100 mg daily.  Follow up in 1 month.      Relevant Medications   hydrOXYzine (ATARAX/VISTARIL) 10 MG tablet   busPIRone (BUSPAR) 7.5 MG tablet   sertraline (ZOLOFT) 100 MG tablet      Follow up plan: Return for 1 month mood f/u.

## 2021-01-04 NOTE — Assessment & Plan Note (Signed)
Chronic.  Follows with [facility] - continue collaboration

## 2021-01-04 NOTE — Assessment & Plan Note (Signed)
Ongoing x years.  Will place referral to ENT for hearing test and for evaluation per patient request.

## 2021-02-08 ENCOUNTER — Ambulatory Visit: Payer: Medicaid Other | Admitting: Nurse Practitioner

## 2021-03-26 ENCOUNTER — Other Ambulatory Visit: Payer: Self-pay | Admitting: Nurse Practitioner

## 2021-03-26 DIAGNOSIS — F419 Anxiety disorder, unspecified: Secondary | ICD-10-CM

## 2021-05-02 ENCOUNTER — Telehealth (INDEPENDENT_AMBULATORY_CARE_PROVIDER_SITE_OTHER): Payer: Medicaid Other | Admitting: Nurse Practitioner

## 2021-05-02 ENCOUNTER — Encounter: Payer: Self-pay | Admitting: Nurse Practitioner

## 2021-05-02 ENCOUNTER — Other Ambulatory Visit: Payer: Self-pay

## 2021-05-02 DIAGNOSIS — J069 Acute upper respiratory infection, unspecified: Secondary | ICD-10-CM

## 2021-05-02 NOTE — Progress Notes (Signed)
Subjective:    Patient ID: Betty Gentry, female    DOB: Aug 02, 1985, 36 y.o.   MRN: [id number]  HPI: Betty Gentry is a 36 y.o. female presenting virtually for upper respiratory infection.  Chief Complaint  Patient presents with   URI   UPPER RESPIRATORY TRACT INFECTION Onset: Sunday Fever: no Body aches: no Chills: no Cough: yes; congested sometimes, other times dry Shortness of breath: no Wheezing: no Chest pain: yes, with cough Chest tightness: no Chest congestion: no Nasal congestion: yes Runny nose: yes Post nasal drip: yes Sneezing: no Sore throat: yes Swollen glands: yes Sinus pressure: yes Headache: no Face pain: no Toothache: no Ear pain: yes ; right ear  Ear pressure: no  Eyes red/itching:no Eye drainage/crusting: no  Nausea: no  Vomiting: no Diarrhea: no  Change in appetite: no  Loss of taste/smell: no  Rash: no Fatigue: yes Sick contacts:  yes; son and daughter recently sick Strep contacts: no  Context: stable Recurrent sinusitis: no Treatments attempted: Sudafed, Nyquil, Alka seltzer day  Relief with OTC medications: some  Allergies  Allergen Reactions   Erythromycin Nausea Only   Nsaids    Sulfa Antibiotics Nausea Only   Tetracyclines & Related Nausea Only    Outpatient Encounter Medications as of 05/02/2021  Medication Sig   b complex vitamins capsule Take 1 capsule by mouth daily.   busPIRone (BUSPAR) 7.5 MG tablet Take 1 tablet (7.5 mg total) by mouth in the morning, at noon, in the evening, and at bedtime.   hydrOXYzine (ATARAX) 10 MG tablet TAKE 1 TABLET BY MOUTH 3 TIMES A DAY AS NEEDED   methocarbamol (ROBAXIN) 750 MG tablet methocarbamol 750 mg tablet   sertraline (ZOLOFT) 100 MG tablet Take 1 tablet (100 mg total) by mouth daily.   SPRINTEC 28 0.25-35 MG-MCG tablet Take 1 tablet by mouth daily.   No facility-administered encounter medications on file as of 05/02/2021.    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 Interim medical history since our last visit reviewed.  Review of Systems Per HPI unless specifically indicated above     Objective:    There were no vitals taken for this visit.  Wt Readings from Last 3 Encounters:  01/04/21 117 lb 9.6 oz (53.3 kg)  09/20/20 120 lb (54.4 kg)  12/17/19 127 lb (57.6 kg)    Physical Exam Vitals and nursing note reviewed.  Constitutional:      General: She is not in acute distress.    Appearance: Normal appearance. She is not ill-appearing or toxic-appearing.  HENT:     Head: Normocephalic and atraumatic.     Nose: Congestion present. No rhinorrhea.     Mouth/Throat:     Mouth: Mucous membranes are moist.     Pharynx: Oropharynx  is clear.  Eyes:     General: No scleral icterus.       Right eye: No discharge.        Left eye: No discharge.     Extraocular Movements: Extraocular movements intact.  Cardiovascular:     Comments: Unable to assess heart sounds via virtual visit. Pulmonary:     Effort: Pulmonary effort is normal. No respiratory distress.     Comments: Unable to assess lung sounds via virtual visit.  Patient talking in complete sentences during telemedicine visit.  No accessory muscle use. Skin:    Coloration: Skin is not jaundiced or pale.      Findings: No erythema.  Neurological:     Mental Status: She is alert and oriented to person, place, and time.  Psychiatric:        Mood and Affect: Mood normal.        Behavior: Behavior normal.        Thought Content: Thought content normal.        Judgment: Judgment normal.      Assessment & Plan:  1. Viral upper respiratory tract infection Acute x3 days.  Encouraged viral testing, however patient politely declined.  Reassured patient that symptoms and exam findings are most consistent with a viral upper respiratory infection and explained lack of efficacy of antibiotics against viruses.  Discussed expected course and features suggestive of secondary bacterial infection.  Continue supportive care. Increase fluid intake with water or electrolyte solution like pedialyte. Encouraged acetaminophen as needed for fever/pain. Encouraged salt water gargling, chloraseptic spray and throat lozenges. Encouraged OTC guaifenesin. Encouraged saline sinus flushes and/or neti with humidified air.  Follow-up with no improvement early next week.  Also follow-up if ear pain worsens or with any drainage from the ear.  Can consider antimicrobial treatment early next week if symptoms do not improve.    Follow up plan: Return if symptoms worsen or fail to improve.  Due to the catastrophic nature of the COVID-19 pandemic, this video visit was completed soley via audio and visual contact via [employer] due to the restrictions of the COVID-19 pandemic.  All issues as above were discussed and addressed. Physical exam was done as above through visual confirmation on [employer]. If it was felt that the patient should be evaluated in the office, they were directed there. The patient verbally consented to this visit. Location of the patient: home Location of the provider: work Those involved with this call:  Provider: Noemi Chapel, DNP, FNP-C CMA: Elizabeth Palau, CMA Front Desk/Registration: Vevelyn Pat   Time spent on call:  6 minutes with patient face to face via video conference. More than 50% of this time was spent in counseling and coordination of care. 13 minutes total spent in review of patient's record and preparation of their chart. I verified patient identity using two factors (patient name and date of birth). Patient consents verbally to being seen via telemedicine visit today.

## 2021-05-04 ENCOUNTER — Other Ambulatory Visit: Payer: Self-pay | Admitting: Nurse Practitioner

## 2021-05-04 ENCOUNTER — Telehealth: Payer: Self-pay

## 2021-05-04 DIAGNOSIS — Z3041 Encounter for surveillance of contraceptive pills: Secondary | ICD-10-CM

## 2021-05-04 MED ORDER — NORGESTIMATE-ETH ESTRADIOL 0.25-35 MG-MCG PO TABS: 1.0000 | ORAL_TABLET | Freq: Every day | ORAL | 1 refills | Status: DC

## 2021-05-04 NOTE — Addendum Note (Signed)
Addended by: Grier Rocher on: 05/04/2021 05:07 PM   Modules accepted: Orders

## 2021-05-04 NOTE — Telephone Encounter (Signed)
Pt called in asking since her virtual visit with NP earlier this week the pain in her ear has not gotten any better. Pt would like for a med for this is possible to be sent to pharmacy. Please advise.  Cb#: [phone]

## 2021-05-07 NOTE — Telephone Encounter (Signed)
Can we please set up another virtual appointment  to discuss symptoms

## 2021-05-07 NOTE — Telephone Encounter (Signed)
Spoke with pt who states that her ear is feeling much better and didn't think she would need a follow up appt with NP. Pt did express that she would like to have refills on her meds, so that pt can have time to find a new pcp. Pt states that she didn't want to run out of meds before finding a pcp.

## 2021-05-22 ENCOUNTER — Other Ambulatory Visit: Payer: Self-pay | Admitting: Nurse Practitioner

## 2021-05-22 DIAGNOSIS — F419 Anxiety disorder, unspecified: Secondary | ICD-10-CM

## 2021-05-23 DIAGNOSIS — H93292 Other abnormal auditory perceptions, left ear: Secondary | ICD-10-CM | POA: Diagnosis not present

## 2021-05-23 DIAGNOSIS — H9192 Unspecified hearing loss, left ear: Secondary | ICD-10-CM | POA: Diagnosis not present

## 2021-05-24 ENCOUNTER — Ambulatory Visit
Admission: EM | Admit: 2021-05-24 | Discharge: 2021-05-24 | Disposition: A | Discharge status: Home / Self Care | Payer: Medicaid Other | Attending: Emergency Medicine | Admitting: Emergency Medicine

## 2021-05-24 ENCOUNTER — Other Ambulatory Visit: Payer: Self-pay

## 2021-05-24 ENCOUNTER — Encounter: Payer: Self-pay | Admitting: Emergency Medicine

## 2021-05-24 DIAGNOSIS — R300 Dysuria: Secondary | ICD-10-CM | POA: Diagnosis not present

## 2021-05-24 DIAGNOSIS — K649 Unspecified hemorrhoids: Secondary | ICD-10-CM | POA: Insufficient documentation

## 2021-05-24 LAB — POCT URINALYSIS DIP (MANUAL ENTRY)
Bilirubin, UA: NEGATIVE
Blood, UA: NEGATIVE
Glucose, UA: 100 mg/dL — AB
Ketones, POC UA: NEGATIVE mg/dL
Leukocytes, UA: NEGATIVE
Nitrite, UA: POSITIVE — AB
Protein Ur, POC: NEGATIVE mg/dL
Spec Grav, UA: 1.025 (ref 1.010–1.025)
Urobilinogen, UA: 1 E.U./dL
pH, UA: 5 (ref 5.0–8.0)

## 2021-05-24 LAB — POCT URINE PREGNANCY: Preg Test, Ur: NEGATIVE

## 2021-05-24 LAB — POCT FASTING CBG KUC MANUAL ENTRY: POCT Glucose (KUC): 89 mg/dL (ref 70–99)

## 2021-05-24 MED ORDER — CEPHALEXIN 500 MG PO CAPS: 500.0000 mg | ORAL_CAPSULE | Freq: Two times a day (BID) | ORAL | 0 refills | Status: AC

## 2021-05-24 MED ORDER — HYDROCORTISONE ACETATE 25 MG RE SUPP: 25.0000 mg | Freq: Two times a day (BID) | RECTAL | 0 refills | Status: DC

## 2021-05-24 NOTE — ED Provider Notes (Signed)
[facility]    CSN: [id number] Arrival date & time: 05/24/21  1530      History   Chief Complaint Chief Complaint  Patient presents with   Dysuria   Hemorrhoids    HPI Betty Gentry is a 36 y.o. female.  Patient presents with 4-day history of dysuria, urgency, suprapubic pressure.  Treatment with Azo.  She also reports exacerbation of external hemorrhoid which started with previous pregnancy.  She was constipated recently and this caused the hemorrhoid to get worse.  The constipation has resolved.  The hemorrhoid is mildly painful; does not itch.  She denies fever, chills, flank pain, vaginal discharge, pelvic pain, blood in stool, or other symptoms.  She denies current pregnancy or breastfeeding.     The history is provided by the patient and medical records.   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  Authorizing Provider  cephALEXin (KEFLEX) 500 MG capsule Take 1 capsule (500 mg total) by mouth 2 (two) times daily for 5 days. 05/24/21 05/29/21 Yes Sharion Balloon, NP  hydrocortisone (ANUSOL-HC) 25 MG suppository Place 1 suppository (25 mg total) rectally 2 (two) times daily. 05/24/21  Yes Sharion Balloon, NP  b complex vitamins capsule Take 1 capsule by mouth daily.    [provider]  busPIRone (BUSPAR) 7.5 MG tablet TAKE 1 TABLET IN THE MORNING, AT NOON, IN THE EVENING AND AT BEDTIME 05/22/21   Eulogio Bear, NP  hydrOXYzine (ATARAX) 10 MG tablet TAKE 1 TABLET BY MOUTH 3 TIMES A DAY AS NEEDED 03/26/21   Cletis Athens, MD  methocarbamol (ROBAXIN) 750 MG tablet methocarbamol 750 mg tablet    [provider]  norgestimate-ethinyl estradiol (ESTARYLLA) 0.25-35 MG-MCG tablet Take 1 tablet by mouth daily. 05/04/21   Eulogio Bear, NP  sertraline (ZOLOFT) 100 MG tablet Take 1 tablet (100 mg total) by mouth daily. 01/04/21   Eulogio Bear, NP    Family History Family History  Problem Relation Age of Onset   Asthma Mother  Hypertension Father    High Cholesterol Father    Arthritis Father    Hyperlipidemia Father    Arthritis Maternal Grandmother    Heart disease Maternal Grandmother    Heart attack Maternal Grandmother    Hyperlipidemia Maternal Grandmother    Miscarriages / Stillbirths Maternal Grandmother    Osteoporosis Maternal Grandmother    Fibromyalgia Maternal Grandmother    Arthritis Maternal Grandfather    Heart attack Maternal Grandfather    Hypertension Maternal Grandfather    Hyperlipidemia Maternal Grandfather    Heart attack Paternal Grandmother    Lung cancer Paternal Grandmother    Arthritis Paternal Grandmother    Cancer Paternal Grandmother    Hyperlipidemia Paternal Grandmother    Heart disease Paternal Grandmother    Hypertension Paternal Grandmother    Miscarriages / Stillbirths Paternal Grandmother    Arthritis Paternal Grandfather     Hyperlipidemia Paternal Grandfather     Social History Social History   Tobacco Use   Smoking status: Never   Smokeless tobacco: Never  Vaping Use   Vaping Use: Some days   Substances: CBD  Substance Use Topics   Alcohol use: Yes   Drug use: No     Allergies   Erythromycin, Nsaids, Sulfa antibiotics, and Tetracyclines & related   Review of Systems Review of Systems  Constitutional:  Negative for chills and fever.  Respiratory:  Negative for cough and shortness of breath.   Cardiovascular:  Negative for chest pain and palpitations.  Gastrointestinal:  Negative for abdominal pain, blood in stool, constipation, diarrhea, nausea and vomiting.       External hemorrhoid.   Genitourinary:  Positive for dysuria, frequency and urgency. Negative for flank pain, hematuria, pelvic pain and vaginal discharge.  Skin:  Negative for color change and rash.  All other systems reviewed and are negative.   Physical Exam Triage Vital Signs ED Triage Vitals [05/24/21 1613]  Enc Vitals Group     BP 120/74     Pulse Rate 83     Resp 18     Temp 98.6 F (37 C)     Temp src      SpO2 98 %     Weight      Height      Head Circumference      Peak Flow      Pain Score      Pain Loc      Pain Edu?      Excl. in [place]?    No data found.  Updated Vital Signs BP 120/74 (BP Location: Left Arm)    Pulse 83    Temp 98.6 F (37 C)    Resp 18    LMP 05/14/2021 (Approximate)    SpO2 98%    Breastfeeding No   Visual Acuity Right Eye Distance:   Left Eye Distance:   Bilateral Distance:    Right Eye Near:   Left Eye Near:    Bilateral Near:     Physical Exam Vitals and nursing note reviewed.  Constitutional:      General: She is not in acute distress.    Appearance: She is well-developed. She is not ill-appearing.  HENT:     Mouth/Throat:     Mouth: Mucous membranes are moist.  Cardiovascular:     Rate and Rhythm: Normal rate and regular rhythm.     Heart sounds: Normal heart  sounds.  Pulmonary:     Effort: Pulmonary effort is normal. No respiratory distress.  Breath sounds: Normal breath sounds.  Abdominal:     General: Bowel sounds are normal.     Palpations: Abdomen is soft.     Tenderness: There is no abdominal tenderness. There is no right CVA tenderness, left CVA tenderness, guarding or rebound.  Genitourinary:    Comments: 3 cm external non-thrombosed flesh-colored hemorrhoid.  Musculoskeletal:     Cervical back: Neck supple.  Skin:    General: Skin is warm and dry.  Neurological:     Mental Status: She is alert.  Psychiatric:        Mood and Affect: Mood normal.        Behavior: Behavior normal.     UC Treatments / Results  Labs (all labs ordered are listed, but only abnormal results are displayed) Labs Reviewed  POCT URINALYSIS DIP (MANUAL ENTRY) - Abnormal; Notable for the following components:      Result Value   Color, UA orange (*)    Glucose, UA =100 (*)    Nitrite, UA Positive (*)    All other components within normal limits  URINE CULTURE  POCT URINE PREGNANCY  POCT FASTING [facility] [place]    EKG   Radiology No results found.  Procedures Procedures (including critical care time)  Medications Ordered in UC Medications - No data to display  Initial Impression / Assessment and Plan / UC Course  I have reviewed the triage vital signs and the nursing notes.  Pertinent labs & imaging results that were available during my care of the patient were reviewed by me and considered in my medical decision making (see chart for details).    Dysuria, External hemorrhoid.  Treating with Keflex. Urine culture pending. Discussed with patient that we will call her if the urine culture shows the need to change or discontinue the antibiotic. Treating hemorrhoid with hydrocortisone suppositories, daily fiber supplement, daily stool softener, Miralax as needed.  Instructed her to follow-up with her PCP if her symptoms are not  improving. Patient agrees to plan of care.     Final Clinical Impressions(s) / UC Diagnoses   Final diagnoses:  Dysuria  Hemorrhoids, unspecified hemorrhoid type     Discharge Instructions      Take the antibiotic as directed.  The urine culture is pending.  We will call you if it shows the need to change or discontinue your antibiotic.    Use the hydrocortisone suppositories as directed.  Take daily fiber supplement such as Metamucil.  Take a daily stool softener such as Colace.  If needed, take MiraLAX for constipation.    Follow up with your primary care provider if your symptoms are not improving.          ED Prescriptions     Medication Sig Dispense Auth. Provider   cephALEXin (KEFLEX) 500 MG capsule Take 1 capsule (500 mg total) by mouth 2 (two) times daily for 5 days. 10 capsule Sharion Balloon, NP   hydrocortisone (ANUSOL-HC) 25 MG suppository Place 1 suppository (25 mg total) rectally 2 (two) times daily. 12 suppository Sharion Balloon, NP      PDMP not reviewed this encounter.   Sharion Balloon, NP 05/24/21 [phone]

## 2021-05-24 NOTE — ED Triage Notes (Signed)
Pt here with suprapubic pressure, painful urination and increased urgency and frequency and possible hemorrhoid x 4 days.

## 2021-05-24 NOTE — Discharge Instructions (Addendum)
Take the antibiotic as directed.  The urine culture is pending.  We will call you if it shows the need to change or discontinue your antibiotic.    Use the hydrocortisone suppositories as directed.  Take daily fiber supplement such as Metamucil.  Take a daily stool softener such as Colace.  If needed, take MiraLAX for constipation.    Follow up with your primary care provider if your symptoms are not improving.

## 2021-05-26 LAB — URINE CULTURE: Culture: 20000 — AB

## 2021-06-21 ENCOUNTER — Other Ambulatory Visit: Payer: Self-pay | Admitting: Internal Medicine

## 2021-06-21 DIAGNOSIS — F419 Anxiety disorder, unspecified: Secondary | ICD-10-CM

## 2021-07-18 ENCOUNTER — Other Ambulatory Visit: Payer: Self-pay | Admitting: Nurse Practitioner

## 2021-07-18 DIAGNOSIS — F53 Postpartum depression: Secondary | ICD-10-CM

## 2021-09-11 ENCOUNTER — Other Ambulatory Visit: Payer: Self-pay | Admitting: Nurse Practitioner

## 2021-09-11 DIAGNOSIS — F419 Anxiety disorder, unspecified: Secondary | ICD-10-CM

## 2021-09-11 NOTE — Telephone Encounter (Signed)
Is this okay to refill  Last filled 08/17/21 #120 Last OV 05/07/21 Next OV None

## 2021-10-03 ENCOUNTER — Telehealth: Payer: Self-pay | Admitting: Internal Medicine

## 2021-10-03 NOTE — Telephone Encounter (Signed)
Please scheduled for a new pt appt. 

## 2021-10-03 NOTE — Telephone Encounter (Signed)
Pt called stating Tullo stated she will take her on as a new pt. Pt stated she works at [employer]

## 2021-10-04 NOTE — Telephone Encounter (Signed)
fyi

## 2021-11-12 ENCOUNTER — Other Ambulatory Visit: Payer: Self-pay | Admitting: Nurse Practitioner

## 2021-11-12 DIAGNOSIS — Z3041 Encounter for surveillance of contraceptive pills: Secondary | ICD-10-CM

## 2021-11-13 ENCOUNTER — Other Ambulatory Visit: Payer: Self-pay | Admitting: Nurse Practitioner

## 2021-11-13 ENCOUNTER — Other Ambulatory Visit: Payer: Self-pay | Admitting: Internal Medicine

## 2021-11-13 DIAGNOSIS — Z3041 Encounter for surveillance of contraceptive pills: Secondary | ICD-10-CM

## 2021-11-13 DIAGNOSIS — F419 Anxiety disorder, unspecified: Secondary | ICD-10-CM

## 2022-01-18 ENCOUNTER — Other Ambulatory Visit: Payer: Self-pay | Admitting: Family Medicine

## 2022-01-18 DIAGNOSIS — F419 Anxiety disorder, unspecified: Secondary | ICD-10-CM

## 2022-02-04 ENCOUNTER — Ambulatory Visit: Payer: Medicaid Other | Admitting: Family

## 2022-02-19 ENCOUNTER — Ambulatory Visit: Payer: Medicaid Other | Admitting: Family

## 2022-02-21 DIAGNOSIS — M791 Myalgia, unspecified site: Secondary | ICD-10-CM | POA: Diagnosis not present

## 2022-02-21 DIAGNOSIS — M545 Low back pain, unspecified: Secondary | ICD-10-CM | POA: Diagnosis not present

## 2022-02-25 ENCOUNTER — Telehealth: Payer: Self-pay

## 2022-02-25 ENCOUNTER — Other Ambulatory Visit: Payer: Self-pay | Admitting: Family Medicine

## 2022-02-25 DIAGNOSIS — F419 Anxiety disorder, unspecified: Secondary | ICD-10-CM

## 2022-02-25 MED ORDER — BUSPIRONE HCL 7.5 MG PO TABS: ORAL_TABLET | ORAL | 0 refills | Status: DC

## 2022-02-25 NOTE — Telephone Encounter (Signed)
Pt is previous patient of Noemi Chapel, NP. Pt is out of Buspar and will not see her new provider for 2 weeks. Pt asks if there is any way a 2 week refill can be sent in for her?

## 2022-03-08 ENCOUNTER — Encounter: Payer: Self-pay | Admitting: Family Medicine

## 2022-03-08 ENCOUNTER — Ambulatory Visit (INDEPENDENT_AMBULATORY_CARE_PROVIDER_SITE_OTHER): Payer: Medicaid Other | Admitting: Family Medicine

## 2022-03-08 ENCOUNTER — Other Ambulatory Visit (HOSPITAL_COMMUNITY)
Admission: RE | Admit: 2022-03-08 | Discharge: 2022-03-08 | Disposition: A | Discharge status: Home / Self Care | Payer: Medicaid Other | Source: Ambulatory Visit | Attending: Obstetrics & Gynecology | Admitting: Obstetrics & Gynecology

## 2022-03-08 VITALS — BP 115/78 | HR 87 | Ht 62.0 in | Wt 120.0 lb

## 2022-03-08 DIAGNOSIS — Z01419 Encounter for gynecological examination (general) (routine) without abnormal findings: Secondary | ICD-10-CM | POA: Insufficient documentation

## 2022-03-08 DIAGNOSIS — Z131 Encounter for screening for diabetes mellitus: Secondary | ICD-10-CM | POA: Diagnosis not present

## 2022-03-08 DIAGNOSIS — Z3169 Encounter for other general counseling and advice on procreation: Secondary | ICD-10-CM

## 2022-03-08 DIAGNOSIS — Z1329 Encounter for screening for other suspected endocrine disorder: Secondary | ICD-10-CM | POA: Diagnosis not present

## 2022-03-08 NOTE — Progress Notes (Signed)
New GYN    LMP:03/05/22 Monthly with Moderat flow Last Pap:12/17/2019 WNL Hx of abnormal pt wants Pap Hx of LEEP STD Screening:Declined   Family Hx of Breast Cancer: None   Patient has been trying to conceive since May 2023.

## 2022-03-08 NOTE — Progress Notes (Signed)
   GYNECOLOGY ANNUAL PREVENTATIVE CARE ENCOUNTER NOTE  Subjective:   Betty Gentry is a 36 y.o. [phone] female here for a routine annual gynecologic exam.  Current complaints:   Trying to conceive- has been trying to conceive since May 2023. She is with the same partner has her prior pregnancies. She reports every 28-35 day cycles with some variation month to month. She is tracking her cycles and having sex in fertile window. No problems conceiving prior 2 pregnancies. No complications in prior pregnancies.   Denies abnormal vaginal bleeding, discharge, pelvic pain, problems with intercourse or other gynecologic concerns.    Gynecologic History Patient's last menstrual period was 03/05/2022. Contraception: none Last Pap: 2021. Results were: normal-- NIL HPV neg Last mammogram: NA .  Health Maintenance Due  Topic Date Due   COVID-19 Vaccine (3 - Moderna series) 09/06/2019    The following portions of the patient's history were reviewed and updated as appropriate: allergies, current medications, past family history, past medical history, past social history, past surgical history and problem list.  Review of Systems Pertinent items are noted in HPI.   Objective:  BP 115/78   Pulse 87   Ht 5\' 2"  (1.575 m)   Wt 120 lb (54.4 kg)   LMP 03/05/2022   BMI 21.95 kg/m  CONSTITUTIONAL: Well-developed, well-nourished female in no acute distress.  HENT:  Normocephalic, atraumatic EYES:  No scleral icterus.  NECK: Normal range of motion, supple, no masses.  Normal thyroid.  SKIN: Skin is warm and dry. No rash noted. Not diaphoretic. No erythema. No pallor. NEUROLOGIC: Alert and oriented to person, place, and time. Normal reflexes, muscle tone coordination. No cranial nerve deficit noted. PSYCHIATRIC: Normal mood and affect. Normal behavior. Normal judgment and thought content. CARDIOVASCULAR: Normal heart rate noted, regular rhythm. 2+ distal pulses. RESPIRATORY: Effort and breath  sounds normal, no problems with respiration noted. BREASTS: Symmetric in size. No masses, skin changes, nipple drainage, or lymphadenopathy. ABDOMEN: Soft,  no distention noted.  No tenderness, rebound or guarding.  PELVIC: Normal appearing external genitalia; normal appearing vaginal mucosa and cervix.  No abnormal discharge noted.  Pap smear obtained.  Normal uterine size, no other palpable masses, no uterine or adnexal tenderness. MUSCULOSKELETAL: Normal range of motion.     Assessment and Plan:  1) Annual gynecologic examination with pap smear:  Will follow up results of pap smear and manage accordingly.  Routine preventative health maintenance measures emphasized.  2) Contraception counseling: trying to conceive.  1. Well woman exam with routine gynecological exam Pap today CBE WNL  2. Encounter for preconception consultation - [phone] Patient is planning on conceiving. We reviewed her current problems and medications in terms of pregnancy safety. Additionally discussed fertility awareness, when to take a pregnancy test, and starting a prenatal vitamin. We discussed general early pregnancy precautions. Reviewed services at the office regarding prenatal care. She voiced understanding.  Problem specific recommendations are: - Has been trying for > 6 months without conceiving and recommended some basic labs to rule out metabolic contributors - Recommended using ovulation predictor kits to help narrow her fertility window.  - Recommended partner semen analysis - Discussed referral to REI at 12 months of trying   Please refer to After Visit Summary for other counseling recommendations.   Return in about 6 months (around 09/06/2022) for Fertility/Conceiving.  09/08/2022, MD, MPH, ABFM Attending Physician Center for [facility]

## 2022-03-11 ENCOUNTER — Ambulatory Visit: Payer: Medicaid Other | Admitting: Internal Medicine

## 2022-03-12 LAB — CYTOLOGY - PAP
Comment: NEGATIVE
Diagnosis: NEGATIVE
High risk HPV: NEGATIVE

## 2022-03-13 LAB — ANTI MULLERIAN HORMONE: ANTI-MULLERIAN HORMONE (AMH): 0.621 ng/mL

## 2022-03-13 LAB — HEMOGLOBIN A1C
Est. average glucose Bld gHb Est-mCnc: 103 mg/dL
Hgb A1c MFr Bld: 5.2 % (ref 4.8–5.6)

## 2022-03-13 LAB — TSH: TSH: 2.4 u[IU]/mL (ref 0.450–4.500)

## 2022-03-28 DIAGNOSIS — M791 Myalgia, unspecified site: Secondary | ICD-10-CM | POA: Diagnosis not present

## 2022-03-28 DIAGNOSIS — M545 Low back pain, unspecified: Secondary | ICD-10-CM | POA: Diagnosis not present

## 2022-04-13 ENCOUNTER — Other Ambulatory Visit: Payer: Self-pay | Admitting: Internal Medicine

## 2022-04-13 ENCOUNTER — Other Ambulatory Visit: Payer: Self-pay | Admitting: Family Medicine

## 2022-04-13 DIAGNOSIS — F419 Anxiety disorder, unspecified: Secondary | ICD-10-CM

## 2022-04-30 ENCOUNTER — Encounter: Payer: Self-pay | Admitting: Family Medicine

## 2022-06-13 ENCOUNTER — Other Ambulatory Visit: Payer: Self-pay | Admitting: Family Medicine

## 2022-06-13 DIAGNOSIS — F419 Anxiety disorder, unspecified: Secondary | ICD-10-CM

## 2022-06-14 ENCOUNTER — Encounter: Payer: Self-pay | Admitting: Family Medicine

## 2022-06-14 DIAGNOSIS — F419 Anxiety disorder, unspecified: Secondary | ICD-10-CM

## 2022-06-14 MED ORDER — BUSPIRONE HCL 7.5 MG PO TABS: ORAL_TABLET | ORAL | 0 refills | Status: DC

## 2022-06-27 ENCOUNTER — Ambulatory Visit (INDEPENDENT_AMBULATORY_CARE_PROVIDER_SITE_OTHER): Payer: Medicaid Other | Admitting: *Deleted

## 2022-06-27 VITALS — BP 112/74 | HR 79

## 2022-06-27 DIAGNOSIS — Z3201 Encounter for pregnancy test, result positive: Secondary | ICD-10-CM

## 2022-06-27 DIAGNOSIS — N912 Amenorrhea, unspecified: Secondary | ICD-10-CM

## 2022-06-27 LAB — POCT URINE PREGNANCY: Preg Test, Ur: POSITIVE — AB

## 2022-06-27 NOTE — Progress Notes (Signed)
ATTESTATION OF SUPERVISION OF RN: Evaluation and management procedures were performed by the RN under my supervision and collaboration. I have reviewed the nursing note and chart and agree with the management and plan for this patient.  Shaneil Yazdi, CNM  

## 2022-06-27 NOTE — Progress Notes (Signed)
Betty Gentry here for a UPT. Pt had a positive upt at home. LMP is Approx 05/28/22.     UPT in office Positive.    Reviewed medications and informed to start a PNV, if not already. Pt to follow up in 4 weeks for New OB interview and then in 6weeks for New OB with a provider.    Crosby Oyster, RN

## 2022-07-09 ENCOUNTER — Encounter: Payer: Self-pay | Admitting: Family Medicine

## 2022-07-15 ENCOUNTER — Other Ambulatory Visit: Payer: Self-pay | Admitting: *Deleted

## 2022-07-15 ENCOUNTER — Encounter: Payer: Self-pay | Admitting: Family Medicine

## 2022-07-15 MED ORDER — PROMETHAZINE HCL 25 MG PO TABS: 25.0000 mg | ORAL_TABLET | Freq: Four times a day (QID) | ORAL | 2 refills | Status: DC | PRN

## 2022-07-18 ENCOUNTER — Encounter: Payer: Self-pay | Admitting: Family Medicine

## 2022-07-23 ENCOUNTER — Encounter: Payer: Self-pay | Admitting: *Deleted

## 2022-07-26 ENCOUNTER — Ambulatory Visit (INDEPENDENT_AMBULATORY_CARE_PROVIDER_SITE_OTHER): Payer: Medicaid Other | Admitting: *Deleted

## 2022-07-26 ENCOUNTER — Other Ambulatory Visit (INDEPENDENT_AMBULATORY_CARE_PROVIDER_SITE_OTHER): Payer: Medicaid Other

## 2022-07-26 VITALS — BP 114/74 | HR 89 | Wt 119.0 lb

## 2022-07-26 DIAGNOSIS — Z3A01 Less than 8 weeks gestation of pregnancy: Secondary | ICD-10-CM

## 2022-07-26 DIAGNOSIS — O0990 Supervision of high risk pregnancy, unspecified, unspecified trimester: Secondary | ICD-10-CM | POA: Insufficient documentation

## 2022-07-26 DIAGNOSIS — Z3481 Encounter for supervision of other normal pregnancy, first trimester: Secondary | ICD-10-CM

## 2022-07-26 DIAGNOSIS — O3680X0 Pregnancy with inconclusive fetal viability, not applicable or unspecified: Secondary | ICD-10-CM

## 2022-07-26 DIAGNOSIS — O09521 Supervision of elderly multigravida, first trimester: Secondary | ICD-10-CM | POA: Insufficient documentation

## 2022-07-26 MED ORDER — METOCLOPRAMIDE HCL 10 MG PO TABS: 10.0000 mg | ORAL_TABLET | Freq: Four times a day (QID) | ORAL | 2 refills | Status: DC | PRN

## 2022-07-26 NOTE — Progress Notes (Signed)
New OB Intake  I explained I am completing New OB Intake today. We discussed her EDD of 03/09/23 that is based on LMP. LMP of 06/02/22. Pt is G3/P2. I reviewed her allergies, medications, Medical/Surgical/OB history, and appropriate screenings.   Patient Active Problem List   Diagnosis Date Noted   Supervision of high risk pregnancy, antepartum 07/26/2022   Anxiety 08/16/2009    Concerns addressed today  Delivery Plans:  Plans to deliver at [facility].   MyChart/Babyscripts MyChart access verified. I explained pt will have some visits in office and some virtually. Babyscripts app discussed and ordered.   Blood Pressure Cuff  BP cuff  given.  Discussed to be used for virtual visits and or if needed BP checks weekly.    Anatomy US Explained first scheduled [place] will be around 19 weeks.   Labs Discussed Avelina Laine genetic screening with patient.  Routine prenatal labs needed.   Placed OB Box on problem list and updated   Patient informed that the ultrasound is considered a limited obstetric ultrasound and is not intended to be a complete ultrasound exam.  Patient also informed that the ultrasound is not being completed with the intent of assessing for fetal or placental anomalies or any pelvic abnormalities. Explained that the purpose of today's ultrasound is to assess for dating and fetal heart rate.  Patient acknowledges the purpose of the exam and the limitations of the study.      First visit review I reviewed new OB appt with pt. I explained she will have ob bloodwork with possible genetic screening. Explained pt will be seen by Dr Shawnie Pons at first visit.    Scheryl Marten, RN 07/26/2022  11:41 AM

## 2022-07-31 ENCOUNTER — Encounter: Payer: Self-pay | Admitting: Family Medicine

## 2022-08-11 ENCOUNTER — Encounter: Payer: Self-pay | Admitting: Family Medicine

## 2022-08-12 ENCOUNTER — Encounter: Payer: Self-pay | Admitting: Family Medicine

## 2022-08-12 ENCOUNTER — Ambulatory Visit (INDEPENDENT_AMBULATORY_CARE_PROVIDER_SITE_OTHER): Payer: Medicaid Other | Admitting: Family Medicine

## 2022-08-12 ENCOUNTER — Other Ambulatory Visit (HOSPITAL_COMMUNITY)
Admission: RE | Admit: 2022-08-12 | Discharge: 2022-08-12 | Disposition: A | Discharge status: Home / Self Care | Payer: Medicaid Other | Source: Ambulatory Visit | Attending: Family Medicine | Admitting: Family Medicine

## 2022-08-12 VITALS — BP 104/66 | HR 79 | Wt 124.0 lb

## 2022-08-12 DIAGNOSIS — O26899 Other specified pregnancy related conditions, unspecified trimester: Secondary | ICD-10-CM | POA: Insufficient documentation

## 2022-08-12 DIAGNOSIS — O0990 Supervision of high risk pregnancy, unspecified, unspecified trimester: Secondary | ICD-10-CM | POA: Insufficient documentation

## 2022-08-12 DIAGNOSIS — O26891 Other specified pregnancy related conditions, first trimester: Secondary | ICD-10-CM | POA: Diagnosis not present

## 2022-08-12 DIAGNOSIS — Z1339 Encounter for screening examination for other mental health and behavioral disorders: Secondary | ICD-10-CM

## 2022-08-12 DIAGNOSIS — O09521 Supervision of elderly multigravida, first trimester: Secondary | ICD-10-CM | POA: Diagnosis not present

## 2022-08-12 DIAGNOSIS — Z6791 Unspecified blood type, Rh negative: Secondary | ICD-10-CM

## 2022-08-12 DIAGNOSIS — Z3A10 10 weeks gestation of pregnancy: Secondary | ICD-10-CM

## 2022-08-12 NOTE — Progress Notes (Signed)
Subjective:   Betty Gentry is a 37 y.o. G3P2002 at [redacted]w[redacted]d by LMP, early ultrasound being seen today for her first obstetrical visit.  Her obstetrical history is significant for advanced maternal age. Pregnancy history fully reviewed.  Patient reports nausea and vomiting.  HISTORY: OB History  Gravida Para Term Preterm AB Living  0 0 2  SAB IAB Ectopic Multiple Live Births  0 0 0 0 2    # Outcome Date GA Lbr Len/2nd Weight Sex Delivery Anes PTL Lv  3 Current           2 Term 11/17/18 [redacted]w[redacted]d 04:06 / 00:11 7 lb 6.7 oz (3.365 kg) M Vag-Spont EPI  LIV     Name: ANDRIENNE, HAVENER     Apgar1: 9  Apgar5: 9  1 Term 03/18/17 [redacted]w[redacted]d / 02:28 8 lb 5.2 oz (3.776 kg) F Vag-Vacuum EPI  LIV     Name: LORIJEAN, HUSSER     Apgar1: 7  Apgar5: 9    Obstetric Comments  3rd degree tear/episitomy   Last pap smear was  02/2022 and was normal Past Medical History:  Diagnosis Date   Acute vaginitis 12/17/2019   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   Fatigue 12/17/2019   Gastroesophageal reflux disease 10/17/2017   History of chicken pox    Knee pain 10/07/2017   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   Preterm labor 10/12/2018   Term pregnancy 11/17/2018   Uterine contractions during pregnancy 03/17/2017   Vaginal delivery 03/18/2017   Vaginal Pap smear, abnormal    Past Surgical History:  Procedure Laterality Date   BELPHAROPTOSIS REPAIR     KNEE SURGERY Right    x3   LEEP     TONSILLECTOMY     Family History  Problem Relation Age of Onset   Asthma Mother    Hypertension Father    High Cholesterol Father    Arthritis Father    Hyperlipidemia Father    Arthritis Maternal Grandmother    Heart disease Maternal Grandmother    Heart attack Maternal Grandmother    Hyperlipidemia Maternal Grandmother    Miscarriages / Stillbirths Maternal Grandmother    Osteoporosis Maternal Grandmother    Fibromyalgia Maternal Grandmother    Arthritis Maternal Grandfather     Heart attack Maternal Grandfather    Hypertension Maternal Grandfather    Hyperlipidemia Maternal Grandfather    Heart attack Paternal Grandmother    Lung cancer Paternal Grandmother    Arthritis Paternal Grandmother    Cancer Paternal Grandmother        Lung   Hyperlipidemia Paternal Grandmother    Heart disease Paternal Grandmother    Hypertension Paternal Grandmother    Miscarriages / Stillbirths Paternal Grandmother    Arthritis Paternal Grandfather    Hyperlipidemia Paternal Grandfather    Social History   Tobacco Use   Smoking status: Never   Smokeless tobacco: Never  Vaping Use   Vaping Use: Former   Substances: CBD  Substance Use Topics   Alcohol use: Not Currently    Comment: socially   Drug use: No   Allergies  Allergen Reactions   Erythromycin Nausea Only   Nsaids    Sulfa Antibiotics Nausea Only   Tetracyclines & Related Nausea Only   Current Outpatient Medications on File Prior to Visit  Medication Sig Dispense Refill   busPIRone (BUSPAR) 7.5 MG tablet TAKE 1 TABLET IN THE MORNING, AT NOON, IN THE EVENING AND AT BEDTIME 120  tablet 0   metoCLOPramide (REGLAN) 10 MG tablet Take 1 tablet (10 mg total) by mouth 4 (four) times daily as needed for nausea or vomiting. 30 tablet 2   Prenatal Vit-Fe Fumarate-FA (PRENATAL MULTIVITAMIN) TABS tablet Take 1 tablet by mouth daily at 12 noon.     promethazine (PHENERGAN) 25 MG tablet Take 1 tablet (25 mg total) by mouth every 6 (six) hours as needed for nausea or vomiting. 30 tablet 2   No current facility-administered medications on file prior to visit.     Exam   Vitals:   08/12/22 1458  BP: 104/66  Pulse: 79  Weight: 124 lb (56.2 kg)   Fetal Heart Rate (bpm): 165  System: General: well-developed, well-nourished female in no acute distress   Skin: normal coloration and turgor, no rashes   Neurologic: oriented, normal, negative, normal mood   Extremities: normal strength, tone, and muscle mass, ROM of all  joints is normal   HEENT Extraocular movement intact and sclera clear, anicteric   Mouth/Teeth mucous membranes moist, pharynx normal without lesions and dental hygiene good   Neck supple and no masses   Cardiovascular: regular rate and rhythm   Respiratory:  no respiratory distress, normal effort   Abdomen: soft, non-tender; bowel sounds normal; no masses,  no organomegaly     Assessment:   Pregnancy: [id number] Patient Active Problem List   Diagnosis Date Noted   Rh negative state in antepartum period 08/12/2022   Supervision of high risk pregnancy, antepartum 07/26/2022   Advanced maternal age in multigravida, first trimester 07/26/2022   Anxiety 08/16/2009     Plan:  1. Supervision of high risk pregnancy, antepartum New OB labs today - CBC/D/Plt+RPR+Rh+ABO+RubIgG... - Culture, OB Urine - Cervicovaginal ancillary only - [place] MFM OB DETAIL +14 WK; Future - [facility] Basic Panel  2. Advanced maternal age in multigravida, first trimester - PANORAMA PRENATAL TEST FULL PANEL  3. Rh negative state in antepartum period Rhogam @ 28 weeks and pp if indicated and with bleeding   Initial labs drawn. Continue prenatal vitamins. Genetic Screening discussed, NIPS: ordered. Ultrasound discussed; fetal anatomic survey: ordered. Problem list reviewed and updated. The nature of [facility] with multiple MDs and other Advanced Practice Providers was explained to patient; also emphasized that residents, students are part of our team. Routine obstetric precautions reviewed. Return in 4 weeks (on 09/09/2022).

## 2022-08-12 NOTE — Progress Notes (Signed)
NOB   Scan done on 07/26/22  Last pap: 03/08/22 WNL   Genetic Screening: Yes and wants to know gender

## 2022-08-13 LAB — CBC/D/PLT+RPR+RH+ABO+RUBIGG...
Antibody Screen: NEGATIVE
Basophils Absolute: 0.1 10*3/uL (ref 0.0–0.2)
Basos: 1 %
EOS (ABSOLUTE): 0.2 10*3/uL (ref 0.0–0.4)
Eos: 2 %
HCV Ab: NONREACTIVE
HIV Screen 4th Generation wRfx: NONREACTIVE
Hematocrit: 38 % (ref 34.0–46.6)
Hemoglobin: 12.7 g/dL (ref 11.1–15.9)
Hepatitis B Surface Ag: NEGATIVE
Immature Grans (Abs): 0.1 10*3/uL (ref 0.0–0.1)
Immature Granulocytes: 1 %
Lymphocytes Absolute: 2.2 10*3/uL (ref 0.7–3.1)
Lymphs: 21 %
MCH: 29.5 pg (ref 26.6–33.0)
MCHC: 33.4 g/dL (ref 31.5–35.7)
MCV: 88 fL (ref 79–97)
Monocytes Absolute: 0.5 10*3/uL (ref 0.1–0.9)
Monocytes: 5 %
Neutrophils Absolute: 7.5 10*3/uL — ABNORMAL HIGH (ref 1.4–7.0)
Neutrophils: 70 %
Platelets: 288 10*3/uL (ref 150–450)
RBC: 4.3 x10E6/uL (ref 3.77–5.28)
RDW: 12.7 % (ref 11.7–15.4)
RPR Ser Ql: NONREACTIVE
Rh Factor: NEGATIVE
Rubella Antibodies, IGG: 1.48 index (ref >0.99)
WBC: 10.5 10*3/uL (ref 3.4–10.8)

## 2022-08-13 LAB — HCV INTERPRETATION

## 2022-08-14 LAB — CERVICOVAGINAL ANCILLARY ONLY
Chlamydia: NEGATIVE
Comment: NEGATIVE
Comment: NORMAL
Neisseria Gonorrhea: NEGATIVE

## 2022-08-14 LAB — CULTURE, OB URINE

## 2022-08-14 LAB — URINE CULTURE, OB REFLEX

## 2022-08-19 LAB — HORIZON CUSTOM: REPORT SUMMARY: NEGATIVE

## 2022-08-20 LAB — PANORAMA PRENATAL TEST FULL PANEL:PANORAMA TEST PLUS 5 ADDITIONAL MICRODELETIONS: FETAL FRACTION: 13.1

## 2022-08-27 ENCOUNTER — Encounter: Payer: Self-pay | Admitting: Family Medicine

## 2022-09-09 ENCOUNTER — Encounter: Payer: Self-pay | Admitting: Family Medicine

## 2022-09-11 ENCOUNTER — Ambulatory Visit (INDEPENDENT_AMBULATORY_CARE_PROVIDER_SITE_OTHER): Payer: Medicaid Other | Admitting: Family Medicine

## 2022-09-11 VITALS — BP 102/68 | HR 83 | Wt 129.0 lb

## 2022-09-11 DIAGNOSIS — O09521 Supervision of elderly multigravida, first trimester: Secondary | ICD-10-CM | POA: Diagnosis not present

## 2022-09-11 DIAGNOSIS — O2652 Maternal hypotension syndrome, second trimester: Secondary | ICD-10-CM

## 2022-09-11 DIAGNOSIS — F419 Anxiety disorder, unspecified: Secondary | ICD-10-CM | POA: Diagnosis not present

## 2022-09-11 DIAGNOSIS — O0990 Supervision of high risk pregnancy, unspecified, unspecified trimester: Secondary | ICD-10-CM

## 2022-09-11 MED ORDER — BUSPIRONE HCL 7.5 MG PO TABS
ORAL_TABLET | ORAL | 3 refills | Status: DC
Start: 2022-09-11 — End: 2023-01-10

## 2022-09-11 NOTE — Progress Notes (Signed)
   PRENATAL VISIT NOTE  Subjective:  Betty Gentry is a 37 y.o. G3P2002 at [redacted]w[redacted]d being seen today for ongoing prenatal care.  She is currently monitored for the following issues for this low-risk pregnancy and has Anxiety; Supervision of high risk pregnancy, antepartum; Advanced maternal age in multigravida, first trimester; Rh negative state in antepartum period; and Maternal hypotension syndrome in second trimester on their problem list.  Patient reports  vagal symptoms and hypotension on most mornings .  Contractions: Not present. Vag. Bleeding: None.  Movement: Absent. Denies leaking of fluid.   The following portions of the patient's history were reviewed and updated as appropriate: allergies, current medications, past family history, past medical history, past social history, past surgical history and problem list.   Objective:   Vitals:   09/11/22 1030  BP: 102/68  Pulse: 83  Weight: 129 lb (58.5 kg)    Fetal Status: Fetal Heart Rate (bpm): 142   Movement: Absent     General:  Alert, oriented and cooperative. Patient is in no acute distress.  Skin: Skin is warm and dry. No rash noted.   Cardiovascular: Normal heart rate noted  Respiratory: Normal respiratory effort, no problems with respiration noted  Abdomen: Soft, gravid, appropriate for gestational age.  Pain/Pressure: Absent     Pelvic: Cervical exam deferred        Extremities: Normal range of motion.  Edema: None  Mental Status: Normal mood and affect. Normal behavior. Normal judgment and thought content.   Assessment and Plan:  Pregnancy: G3P2002 at [redacted]w[redacted]d 1. Supervision of high risk pregnancy, antepartum Anatomy u/s is ordered and scheduled  2. Anxiety Refilled her Buspar - busPIRone (BUSPAR) 7.5 MG tablet; TAKE 1 TABLET IN THE MORNING, AT NOON, IN THE EVENING AND AT BEDTIME  Dispense: 120 tablet; Refill: 3  3. Advanced maternal age in multigravida, first trimester LR NIPT  4. Maternal hypotension  syndrome in second trimester Increase fluid and salt intake, lie down if needed  Preterm labor symptoms and general obstetric precautions including but not limited to vaginal bleeding, contractions, leaking of fluid and fetal movement were reviewed in detail with the patient. Please refer to After Visit Summary for other counseling recommendations.   Return in 4 weeks (on 10/09/2022).  Future Appointments  Date Time Provider Department Center  10/09/2022 11:15 AM Reva Bores, MD [facility]  10/14/2022 12:45 PM [facility] NURSE [facility]  10/14/2022  1:00 PM [facility] US1 [facility]  11/06/2022 10:35 AM Anyanwu, Jethro Bastos, MD [facility]    Reva Bores, MD

## 2022-09-11 NOTE — Progress Notes (Signed)
ROB   CC: Low B/P readings. Reports feeling faint and dizzy. Readings will be 90/50s around 9:30am. Denies any visual changes.   Pt request refill on Buspar.

## 2022-10-09 ENCOUNTER — Encounter: Payer: Self-pay | Admitting: Family Medicine

## 2022-10-09 ENCOUNTER — Ambulatory Visit (INDEPENDENT_AMBULATORY_CARE_PROVIDER_SITE_OTHER): Payer: Medicaid Other | Admitting: Family Medicine

## 2022-10-09 VITALS — BP 100/64 | HR 87 | Wt 133.0 lb

## 2022-10-09 DIAGNOSIS — O09521 Supervision of elderly multigravida, first trimester: Secondary | ICD-10-CM | POA: Diagnosis not present

## 2022-10-09 DIAGNOSIS — O26899 Other specified pregnancy related conditions, unspecified trimester: Secondary | ICD-10-CM

## 2022-10-09 DIAGNOSIS — O0990 Supervision of high risk pregnancy, unspecified, unspecified trimester: Secondary | ICD-10-CM

## 2022-10-09 DIAGNOSIS — Z6791 Unspecified blood type, Rh negative: Secondary | ICD-10-CM

## 2022-10-09 NOTE — Progress Notes (Signed)
CC: ROB, denies any concerns  AFP: yes

## 2022-10-09 NOTE — Progress Notes (Signed)
    PRENATAL VISIT NOTE  Subjective:  Betty Gentry is a 37 y.o. G3P2002 at [redacted]w[redacted]d being seen today for ongoing prenatal care.  She is currently monitored for the following issues for this low-risk pregnancy and has Anxiety; Supervision of high risk pregnancy, antepartum; Advanced maternal age in multigravida, first trimester; Rh negative state in antepartum period; and Maternal hypotension syndrome in second trimester on their problem list.  Patient reports no complaints.  Contractions: Not present. Vag. Bleeding: None.  Movement: Present. Denies leaking of fluid.   The following portions of the patient's history were reviewed and updated as appropriate: allergies, current medications, past family history, past medical history, past social history, past surgical history and problem list.   Objective:   Vitals:   10/09/22 1116  BP: 100/64  Pulse: 87  Weight: 133 lb (60.3 kg)    Fetal Status: Fetal Heart Rate (bpm): 141   Movement: Present     General:  Alert, oriented and cooperative. Patient is in no acute distress.  Skin: Skin is warm and dry. No rash noted.   Cardiovascular: Normal heart rate noted  Respiratory: Normal respiratory effort, no problems with respiration noted  Abdomen: Soft, gravid, appropriate for gestational age.  Pain/Pressure: Absent     Pelvic: Cervical exam deferred        Extremities: Normal range of motion.  Edema: None  Mental Status: Normal mood and affect. Normal behavior. Normal judgment and thought content.   Assessment and Plan:  Pregnancy: G3P2002 at [redacted]w[redacted]d 1. Supervision of high risk pregnancy, antepartum Continue routine prenatal care. - AFP, Serum, Open Spina Bifida  2. Rh negative state in antepartum period Will need Rhogam @ 28 weeks and pp if indicated  3. Advanced maternal age in multigravida, first trimester LR NIPT  Preterm labor symptoms and general obstetric precautions including but not limited to vaginal bleeding,  contractions, leaking of fluid and fetal movement were reviewed in detail with the patient. Please refer to After Visit Summary for other counseling recommendations.   Return in 4 weeks (on 11/06/2022).  Future Appointments  Date Time Provider Department Center  10/14/2022 12:45 PM [facility] NURSE [facility]  10/14/2022  1:00 PM [facility] US1 [facility]  11/06/2022 10:35 AM Anyanwu, Jethro Bastos, MD [facility]  12/04/2022 11:15 AM Reva Bores, MD [facility]  12/18/2022  8:15 AM [facility] LAB [facility]  12/18/2022  8:35 AM Anyanwu, Jethro Bastos, MD [facility]    Reva Bores, MD

## 2022-10-11 LAB — AFP, SERUM, OPEN SPINA BIFIDA
AFP MoM: 1.23
AFP Value: 61.5 ng/mL
Gest. Age on Collection Date: 18.3 weeks
Maternal Age At EDD: 36.1 yr
OSBR Risk 1 IN: 5926
Test Results:: NEGATIVE
Weight: 133 [lb_av]

## 2022-10-14 ENCOUNTER — Other Ambulatory Visit: Payer: Self-pay | Admitting: Family Medicine

## 2022-10-14 ENCOUNTER — Ambulatory Visit (HOSPITAL_BASED_OUTPATIENT_CLINIC_OR_DEPARTMENT_OTHER): Payer: Medicaid Other | Admitting: Maternal & Fetal Medicine

## 2022-10-14 ENCOUNTER — Ambulatory Visit: Payer: Medicaid Other | Attending: Family Medicine

## 2022-10-14 ENCOUNTER — Ambulatory Visit: Payer: Medicaid Other

## 2022-10-14 ENCOUNTER — Ambulatory Visit: Payer: Medicaid Other | Admitting: *Deleted

## 2022-10-14 ENCOUNTER — Encounter: Payer: Self-pay | Admitting: *Deleted

## 2022-10-14 VITALS — BP 112/59 | HR 75

## 2022-10-14 DIAGNOSIS — O360120 Maternal care for anti-D [Rh] antibodies, second trimester, not applicable or unspecified: Secondary | ICD-10-CM

## 2022-10-14 DIAGNOSIS — O09521 Supervision of elderly multigravida, first trimester: Secondary | ICD-10-CM

## 2022-10-14 DIAGNOSIS — O26899 Other specified pregnancy related conditions, unspecified trimester: Secondary | ICD-10-CM

## 2022-10-14 DIAGNOSIS — I959 Hypotension, unspecified: Secondary | ICD-10-CM | POA: Insufficient documentation

## 2022-10-14 DIAGNOSIS — O09522 Supervision of elderly multigravida, second trimester: Secondary | ICD-10-CM | POA: Diagnosis not present

## 2022-10-14 DIAGNOSIS — O283 Abnormal ultrasonic finding on antenatal screening of mother: Secondary | ICD-10-CM | POA: Insufficient documentation

## 2022-10-14 DIAGNOSIS — O3504X0 Maternal care for (suspected) central nervous system malformation or damage in fetus, encephalocele, not applicable or unspecified: Secondary | ICD-10-CM | POA: Diagnosis not present

## 2022-10-14 DIAGNOSIS — Z363 Encounter for antenatal screening for malformations: Secondary | ICD-10-CM | POA: Insufficient documentation

## 2022-10-14 DIAGNOSIS — Z3A19 19 weeks gestation of pregnancy: Secondary | ICD-10-CM

## 2022-10-14 DIAGNOSIS — O09512 Supervision of elderly primigravida, second trimester: Secondary | ICD-10-CM | POA: Insufficient documentation

## 2022-10-14 DIAGNOSIS — O0990 Supervision of high risk pregnancy, unspecified, unspecified trimester: Secondary | ICD-10-CM

## 2022-10-14 DIAGNOSIS — Z23 Encounter for immunization: Secondary | ICD-10-CM | POA: Diagnosis not present

## 2022-10-14 DIAGNOSIS — Q019 Encephalocele, unspecified: Secondary | ICD-10-CM | POA: Diagnosis present

## 2022-10-14 DIAGNOSIS — Z3A24 24 weeks gestation of pregnancy: Secondary | ICD-10-CM | POA: Diagnosis not present

## 2022-10-14 DIAGNOSIS — O2652 Maternal hypotension syndrome, second trimester: Secondary | ICD-10-CM | POA: Diagnosis not present

## 2022-10-14 DIAGNOSIS — Z362 Encounter for other antenatal screening follow-up: Secondary | ICD-10-CM

## 2022-10-14 MED ORDER — RHO D IMMUNE GLOBULIN 1500 UNIT/2ML IJ SOSY
300.0000 ug | PREFILLED_SYRINGE | Freq: Once | INTRAMUSCULAR | Status: AC
  Administered 2022-10-14: 300 ug via INTRAMUSCULAR

## 2022-10-14 NOTE — Progress Notes (Signed)
Amnio specimen taken to lab and handed off to lab technician

## 2022-10-14 NOTE — Progress Notes (Unsigned)
Patient information  Patient Name: Betty Gentry  Patient MRN:   [id number]  Referring practice: MFM Referring Provider: [facility]  MFM CONSULT  Betty Gentry is a 37 y.o. [id number] at [redacted]w[redacted]d here for ultrasound and consultation.   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  The cranium is seen to have an open defect in the posterior aspect of the level of the cerebellum.  There is clearly brain tissue herniating through the defect.  This is most consistent with a posterior encephalocele.  The remainder of the CNS structures do appear normal including the CSP, lateral ventricles and parenchyma.  There is no evidence of renal abnormalities or polydactyly. I discussed the diagnosis, management and prognosis and establish a plan of care. I also discussed the role of amniocentesis, future ultrasounds, referral for pediatric neurosurgery, brain MRI and consideration of termination of pregnancy.  At this time I do not suspect an underlying genetic or syndromic association which does have an improved prognosis compared to those who have an underlying defect. A brain MRI will be critical to assess the other CNS structures.   Overview: This condition occurs in up to 1 and 5000 pregnancies.  The posterior occipital region is the most common location.  Typically an encephalocele is an isolated finding but some cases do have genetic or syndromic associations.  The exact pathogenesis is unclear but is due to a failure in the closure of the neural tube prior to 26 days of conception.    Differential diagnosis: includes other scalp masses such as hemangiomas, nuchal tumors, cephalohematomas, teratomas, hygromas and cysts.  Amniotic band syndrome is also a possible cause especially when other defects are noted although this is not suspected at this time.   Associated syndromes: The most common associated syndromes with  encephalocele are: -Meckel Danella Deis syndrome (encephalocele, bilateral polycystic kidneys and post axial polydactyly) -Walker-Warburg or HARD  E syndrome ( h ydrocephaly, a gyria, r etinal d ysplasia, with or without e ncephalocele) -Knobloch syndrome (vitreoretinal degeneration and occipital encephalocele)    Associated anomalies: Up to 65% of fetuses and infants with cephalocele's can have at least one other additional major malformation.  The remaining 35% are isolated. The most common associated malformations are: -Cardiovascular (ventricular septal defect [VSD], coarctation of the aorta, single umbilical artery) -Urogenital tract (urinary tract dilatation [pyelectasis], ureteral agenesis) -Malformations of the extremities (talipes equinovarus) -Malformations of the thorax and abdominal wall (gastroschisis, diaphragmatic hernia, costal malformations).  -CNS anomalies include microcephaly (up to 25%), hydrocephaly (30%-50%), agenesis of the corpus callosum, Dandy-Walker malformation, holoprosencephaly (lobar), Arnold-Chiari type II malformation, and cerebral dysgenesis.  Prognosis: There is nearly 80% mortality rate when diagnosed prenatally. Among survivors who undergo treatment neurologic outcomes are difficult to predict and depend on the size and location of the lesion as well as any underlying genetic or syndromic associations.  Worse prognosis is associated when there is an underlying genetic or syndromic association.  For those who survive the fetal.  A large range of neurodevelopmental outcomes have been reported.  For those wo survive and undergo surgical repair outcomes range from normal neurodevelopment (50%), mild-moderate disability (25%), and severe disability (25%). Up to 5% die in the years following birth.  Fetuses with associated syndromes or underlying genetic abnormalities have worse outcomes. Meckel syndrome and Walker-Warburg syndrome have a poor prognosis with a significant  number of fetuses stillborn or dying shortly after birth. In Knobloch syndrome, severe  visual deficits eventually leading to blindness.  Antenatal management: Detailed anatomic ultrasound, fetal echocardiography, amniocentesis, serial growth ultrasounds and antenatal testing are indicated for those future to continue pregnancy.  Termination has been offered and discussed but currently declined.  Referral to pediatric neurosurgery will be arranged.  NICU consultation will be done prior to delivery.  Delivery: Delivery should occur at a tertiary care hospital with pediatric neurology and neurosurgery as well as NICU. This will be arranged.  Amniocentesis procedure After counseling the patient about the risks, benefits and alternatives to amniocentesis, the patient requests to have an amniocentesis procedure today for definitive diagnosis of fetal aneuploidy.   She denies any problems since her last exam. After informed consent was obtained, a timeout was performed verifying the patient's identity and the indications for the procedure.  The patient was then prepped and draped in the usual sterile fashion. An uncomplicated genetic amniocentesis was performed today obtaining 30 cc of straw-colored amniotic fluid which was then sent off for amniotic fluid AFP, and chromosome analysis.  The patient was advised that our genetic counselor will notify her regarding the results of the amniocentesis.  Post amniocentesis instructions were discussed.  The patient's blood type is Rh-, RhoGam was given following the procedure.  Assessment 1. Encephalocele of fetus affecting antepartum care of mother, single or unspecified fetus 2. [redacted] weeks gestation of pregnancy 3. Rh negative Plan -Amniocentesis was performed today without difficulty. Rhogam given.  -I discussed the role of termination and the [place] laws surrounding termination of pregnancy.  I discussed that this is a life limiting anomaly even with surgery  and termination would be permissible to the 24th week of gestation.  At this time she does not wish to pursue termination until she has had further consultation and imaging. -I discussed the case with Dr. Chrisandra Carota at [place] [facility] maternal-fetal medicine.  I have made a urgent referral to the MFM department.  They will further coordinate consultation with pediatric neurosurgery and brain MRI of the fetus. -Fetal echo order was placed for [facility] -Follow-up in 2 weeks to discuss results of amniocentesis and perform a limited ultrasound to assess fetal wellbeing. -If the pregnancy is continued we will coordinate serial growth ultrasounds and antenatal testing starting at third trimester. -Delivery would have to occur at a tertiary care center with the ability to perform fetal surgery.  This will likely be [facility] if she completes her consultation at that facility.  Review of Systems: A review of systems was performed and was negative except per HPI   Vitals and Physical Exam    10/14/2022   12:49 PM 10/09/2022   11:16 AM 09/11/2022   10:30 AM  Vitals with BMI  Weight  133 lbs 129 lbs  Systolic 112 100 098  Diastolic 59 64 68  Pulse 75 87 83  Sitting comfortably on the sonogram table Nonlabored breathing Normal rate and rhythm Abdomen is nontender  Past pregnancies OB History  Gravida Para Term Preterm AB Living  3 2 2     2   SAB IAB Ectopic Multiple Live Births        0 2    # Outcome Date GA Lbr Len/2nd Weight Sex Delivery Anes PTL Lv  3 Current           2 Term 11/17/18 [redacted]w[redacted]d 04:06 / 00:11 3.365 kg M Vag-Spont EPI  LIV  1 Term 03/18/17 [redacted]w[redacted]d / 02:28 3.776 kg F  Vag-Vacuum EPI  LIV    Obstetric Comments  3rd degree tear/episitomy     I spent 60 minutes reviewing the patients chart, including labs and images as well as counseling the patient about her medical conditions. Greater than 50% of the time was spent  in direct face-to-face patient counseling.  Braxton Feathers  MFM, [facility]   10/14/2022  4:42 PM

## 2022-10-15 ENCOUNTER — Telehealth: Payer: Self-pay | Admitting: *Deleted

## 2022-10-15 ENCOUNTER — Encounter: Payer: Self-pay | Admitting: Family Medicine

## 2022-10-15 DIAGNOSIS — O3504X0 Maternal care for (suspected) central nervous system malformation or damage in fetus, encephalocele, not applicable or unspecified: Secondary | ICD-10-CM | POA: Insufficient documentation

## 2022-10-15 NOTE — Telephone Encounter (Signed)
See Phone documentation

## 2022-10-15 NOTE — Addendum Note (Signed)
Addended by: Lovina Reach on: 10/15/2022 11:05 AM   Modules accepted: Orders

## 2022-10-15 NOTE — Addendum Note (Signed)
Addended by: Lovina Reach on: 10/15/2022 10:11 AM   Modules accepted: Orders

## 2022-10-18 LAB — CHROMOSOME ANALYSIS W REFLEX TO SNP, AMNIOTIC

## 2022-10-18 LAB — SPECIMEN STATUS REPORT

## 2022-10-22 DIAGNOSIS — O3504X0 Maternal care for (suspected) central nervous system malformation or damage in fetus, encephalocele, not applicable or unspecified: Secondary | ICD-10-CM | POA: Diagnosis not present

## 2022-10-22 DIAGNOSIS — Z3A20 20 weeks gestation of pregnancy: Secondary | ICD-10-CM | POA: Diagnosis not present

## 2022-10-22 DIAGNOSIS — O26892 Other specified pregnancy related conditions, second trimester: Secondary | ICD-10-CM | POA: Diagnosis not present

## 2022-10-23 ENCOUNTER — Encounter: Payer: Self-pay | Admitting: Family Medicine

## 2022-10-23 ENCOUNTER — Other Ambulatory Visit: Payer: Self-pay | Admitting: *Deleted

## 2022-10-23 DIAGNOSIS — Q079 Congenital malformation of nervous system, unspecified: Secondary | ICD-10-CM

## 2022-10-25 ENCOUNTER — Telehealth: Payer: Self-pay | Admitting: *Deleted

## 2022-10-25 ENCOUNTER — Other Ambulatory Visit: Payer: Self-pay | Admitting: *Deleted

## 2022-10-25 ENCOUNTER — Encounter: Payer: Self-pay | Admitting: *Deleted

## 2022-10-25 ENCOUNTER — Encounter: Payer: Self-pay | Admitting: Family Medicine

## 2022-10-25 DIAGNOSIS — O3504X0 Maternal care for (suspected) central nervous system malformation or damage in fetus, encephalocele, not applicable or unspecified: Secondary | ICD-10-CM

## 2022-10-25 NOTE — Telephone Encounter (Signed)
Called pt to let her know we need her to come by and sign 72 hours paperwork. Pt desires a D&C and so Dr Shawnie Pons will work on referral for her to [facility] or [place].

## 2022-10-28 ENCOUNTER — Ambulatory Visit: Payer: Medicaid Other

## 2022-10-28 ENCOUNTER — Telehealth: Payer: Self-pay | Admitting: Obstetrics and Gynecology

## 2022-10-28 LAB — CYTOGENETICS WEB TRACKING

## 2022-10-28 LAB — MCC TRACKING

## 2022-10-28 NOTE — Telephone Encounter (Signed)
  I spoke with Betty Gentry to review the results that are available from her recent amniocentesis.    Two tests have been performed on the amniotic fluid sample.  The first test, Alpha Fetoprotein, detects > 98% of open neural tube defects (e.g. spina bifida).  This test was within normal limits on the sample. This indicates that the encephalocele does not include an opening into the amniotic fluid.  The second test was an analysis of the fetal chromosomes.  The number and structure of the chromosomes were analyzed to detect problems such as Down syndrome, Trisomy 13 and Trisomy 18 with > 99.5% accuracy.  This analysis on the sample revealed a baby with apparently normal chromosomes (46,XX).  The chromosomal microarray testing is still pending and should be available in about 2 weeks.  We will call the patient with these results when they become available.  Cherly Anderson, MS, CGC

## 2022-10-29 DIAGNOSIS — Q01 Encephalocele: Secondary | ICD-10-CM

## 2022-10-29 HISTORY — PX: INDUCED ABORTION: SHX677

## 2022-11-01 ENCOUNTER — Encounter: Payer: Self-pay | Admitting: Family Medicine

## 2022-11-01 MED ORDER — HYDROXYZINE HCL 10 MG PO TABS: 10.0000 mg | ORAL_TABLET | Freq: Three times a day (TID) | ORAL | 0 refills | Status: DC | PRN

## 2022-11-05 LAB — CHROMOSOME MICROARRAY REFLEX, AMN FLD

## 2022-11-05 LAB — MATERNAL CELL CONTAMINATION

## 2022-11-05 LAB — CHROMOSOME ANALYSIS W REFLEX TO SNP, AMNIOTIC
Cells Analyzed: 15
Cells Counted: 15
Cells Karyotyped: 2
Colonies: 15
GTG Band Resolution Achieved: 400

## 2022-11-05 LAB — AFP, AMNIOTIC FLUID
AFP, Amniotic Fluid (mcg/ml): 5.9 ug/mL
Gestational Age(Wks): 19
MOM, Amniotic Fluid: 0.75

## 2022-11-06 ENCOUNTER — Encounter: Payer: Medicaid Other | Admitting: Obstetrics & Gynecology

## 2022-11-06 ENCOUNTER — Telehealth: Payer: Self-pay | Admitting: Obstetrics and Gynecology

## 2022-11-06 NOTE — Telephone Encounter (Signed)
Calling to give patient microarray results from amniocentesis - left voicemail for the patient to call me about the results.  Per notes from [facility], patient elected TOP on 7/9 at [facility] due to prenatal ultrasound findings.  Cherly Anderson, MS, CGC

## 2022-11-07 ENCOUNTER — Telehealth: Payer: Self-pay | Admitting: Obstetrics and Gynecology

## 2022-11-07 NOTE — Telephone Encounter (Signed)
I spoke with Betty Gentry to follow-up on the results of the chromosome microarray from her recent amniocentesis.  Microarray analysis revealed a normal female complement with no significant DNA copy number changes or copy neutral regions and no maternal cell contamination. This significantly reduces the possibility of a chromosomal microdeletion or microduplication syndrome in this fetus. I reiterated that these results do not exclude the possibility of a single gene disorder that could be associated with encephalocele, but that within the limits of the testing performed on the amniocentesis there is no clear identified cause.  The patient elected to terminate the pregnancy at [facility] due to the fetal findings.  We encouraged her to contact us in the future if we can be of assistance with any questions or family-planning.  We can be reached at [phone].  Cherly Anderson, MS, CGC

## 2022-11-20 ENCOUNTER — Encounter: Payer: Self-pay | Admitting: Family Medicine

## 2022-11-20 ENCOUNTER — Telehealth (INDEPENDENT_AMBULATORY_CARE_PROVIDER_SITE_OTHER): Payer: Medicaid Other | Admitting: Family Medicine

## 2022-11-20 VITALS — BP 132/84 | HR 91

## 2022-11-20 DIAGNOSIS — F419 Anxiety disorder, unspecified: Secondary | ICD-10-CM

## 2022-11-20 DIAGNOSIS — O09299 Supervision of pregnancy with other poor reproductive or obstetric history, unspecified trimester: Secondary | ICD-10-CM

## 2022-11-20 DIAGNOSIS — Z3A00 Weeks of gestation of pregnancy not specified: Secondary | ICD-10-CM | POA: Diagnosis not present

## 2022-11-20 MED ORDER — ESCITALOPRAM OXALATE 10 MG PO TABS
10.0000 mg | ORAL_TABLET | Freq: Every day | ORAL | 2 refills | Status: DC
Start: 2022-11-20 — End: 2023-01-10

## 2022-11-20 MED ORDER — FOLIC ACID 1 MG PO TABS: 4.0000 mg | ORAL_TABLET | Freq: Every day | ORAL | 3 refills | Status: DC

## 2022-11-20 NOTE — Progress Notes (Signed)
GYNECOLOGY VIRTUAL VISIT ENCOUNTER NOTE  Provider location: [facility]   Patient location: Home  I connected with Betty Gentry on 11/20/22 at  1:10 PM EDT by MyChart Video Encounter and verified that I am speaking with the correct person using two identifiers.   I discussed the limitations, risks, security and privacy concerns of performing an evaluation and management service virtually and the availability of in person appointments. I also discussed with the patient that there may be a patient responsible charge related to this service. The patient expressed understanding and agreed to proceed.   History:  Betty Gentry is a 37 y.o. G28P2002 female being evaluated today for f/u anxiety. She reports her nerves are the biggest issue. She is obviously struggling with sadness as welland was tearful during the conversation. She denies any abnormal vaginal discharge, bleeding, pelvic pain or other concerns.       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   Health Maintenance:  Normal pap and negative HRHPV on 03/08/2022.    Review of Systems:  Pertinent items noted in HPI and  remainder of comprehensive ROS otherwise negative.  Physical Exam:   General:  Alert, oriented and cooperative. Patient appears to be in no acute distress.  Mental Status: Normal mood and affect. Normal behavior. Normal judgment and thought content.   Respiratory: Normal respiratory effort, no problems with respiration noted  Rest of physical exam deferred due to type of encounter  Labs and Imaging No results found for this or any previous visit (from the past 336 hour(s)). No results found.     Assessment and Plan:     Problem List Items Addressed This Visit       Unprioritized   Anxiety - Primary    Has been on Zoloft in the past. Wants to try something different. Will start Lexapro and take 1/2 tab x 5-7 days. If not having a lot of side effects, increase to 1 tab daily. May need dose increase in 4 weeks. IBH referral. Continue Buspar and Vistaril if needed. Likely complicated grief reaction.      Relevant Medications   escitalopram (LEXAPRO) 10 MG tablet   Other Relevant Orders   Ambulatory referral to Integrated Behavioral Health   Other Visit Diagnoses     History of fetal anomaly in prior pregnancy - encephalocele       Added 4 mg folic acid to prevent in future pregnancies      Return in about 4 weeks (around 12/18/2022) for a follow-up, can be in person or virtual.       I discussed the assessment and treatment plan with the  patient. The patient was provided an opportunity to ask questions and all were answered. The patient agreed with the plan and demonstrated an understanding of the instructions.   The patient was advised to call back or seek an in-person evaluation/go to the ED if the symptoms worsen or if the condition fails to improve as anticipated.  I provided 9 minutes of face-to-face time during this encounter.   Reva Bores, MD [facility] [employer], [facility]

## 2022-11-20 NOTE — Assessment & Plan Note (Signed)
Has been on Zoloft in the past. Wants to try something different. Will start Lexapro and take 1/2 tab x 5-7 days. If not having a lot of side effects, increase to 1 tab daily. May need dose increase in 4 weeks. IBH referral. Continue Buspar and Vistaril if needed. Likely complicated grief reaction.

## 2022-11-20 NOTE — Progress Notes (Signed)
CC: Pt stating that she is Okay, Pt does not feel any better with the medication, Pt very upset on the phone.

## 2022-11-25 NOTE — BH Specialist Note (Unsigned)
Integrated Behavioral Health via Telemedicine Visit  11/25/2022 Betty Gentry [id number]  Number of Integrated Behavioral Health Clinician visits: No data recorded Session Start time: No data recorded  Session End time: No data recorded Total time in minutes: No data recorded  Referring Provider: Tinnie Gens, MD Patient/Family location: Home*** [facility] Provider location: [facility]  All persons participating in visit: Patient Betty Gentry and [facility] Betty Gentry ***  Types of Service: {CHL AMB TYPE OF SERVICE:2363825293}  I connected with Betty Gentry and/or Betty Gentry's {family members:20773} via  Telephone or Video Enabled Telemedicine Application  (Video is Caregility application) and verified that I am speaking with the correct person using two identifiers. Discussed confidentiality: Yes   I discussed the limitations of telemedicine and the availability of in person appointments.  Discussed there is a possibility of technology failure and discussed alternative modes of communication if that failure occurs.  I discussed that engaging in this telemedicine visit, they consent to the provision of behavioral healthcare and the services will be billed under their insurance.  Patient and/or legal guardian expressed understanding and consented to Telemedicine visit: Yes   Presenting Concerns: Patient and/or family reports the following symptoms/concerns: *** Duration of problem: ***; Severity of problem: {Mild/Moderate/Severe:20260}  Patient and/or Family's Strengths/Protective Factors: {CHL AMB BH PROTECTIVE [phone]}  Goals Addressed: Patient will:  Reduce symptoms of: {IBH Symptoms:21014056}   Increase knowledge and/or ability of: {IBH Patient Tools:21014057}   Demonstrate ability to: {IBH Goals:21014053}  Progress towards Goals: {CHL AMB BH PROGRESS TOWARDS  WUJWJ:1914782956}  Interventions: Interventions utilized:  {IBH Interventions:21014054} Standardized Assessments completed: {IBH Screening Tools:21014051}  Patient and/or Family Response: Patient Betty Gentry and [facility] Betty Gentry ***   Assessment: Patient currently experiencing ***.   Patient may benefit from psychoeducation and brief therapeutic interventions regarding coping with symptoms of *** .  Plan: Follow up with behavioral health clinician on : *** Behavioral recommendations:  -*** -*** Referral(s): {IBH Referrals:21014055}  I discussed the assessment and treatment plan with the patient and/or parent/guardian. They were provided an opportunity to ask questions and all were answered. They agreed with the plan and demonstrated an understanding of the instructions.   They were advised to call back or seek an in-person evaluation if the symptoms worsen or if the condition fails to improve as anticipated.  Rae Lips, LCSW     08/12/2022    3:11 PM 01/04/2021    3:38 PM 12/17/2019    9:24 AM 10/17/2017    8:13 AM 10/07/2017    8:35 AM  Depression screen PHQ 2/9  Decreased Interest 0 0 0 0 0  Down, Depressed, Hopeless 0 0 0 0 0  PHQ - 2 Score 0 0 0 0 0  Altered sleeping 1  0    Tired, decreased energy 1  2    Change in appetite 0  1    Feeling bad or failure about yourself  0  0    Trouble concentrating 0  1    Moving slowly or fidgety/restless 0  0    Suicidal thoughts 0  0    PHQ-9 Score 2  4    Difficult doing work/chores   Somewhat difficult        08/12/2022    3:11 PM 12/17/2019    9:23 AM 12/09/2017    4:04 PM 10/17/2017    8:40 AM  GAD 7 : Generalized Anxiety Score  Nervous, Anxious, on  Edge 1 2 2 1   Control/stop worrying 0 1 2 1   Worry too much - different things 0 1 2 1   Trouble relaxing 0 1 2 1   Restless 0 1 1 0  Easily annoyed or irritable 1 1 3 3   Afraid - awful might happen 0 1 1 1   Total GAD 7 Score 2 8 13 8   Anxiety Difficulty   Somewhat difficult

## 2022-11-28 ENCOUNTER — Other Ambulatory Visit: Payer: Self-pay | Admitting: Family Medicine

## 2022-12-03 ENCOUNTER — Ambulatory Visit: Payer: Self-pay | Admitting: Clinical

## 2022-12-03 DIAGNOSIS — F4321 Adjustment disorder with depressed mood: Secondary | ICD-10-CM

## 2022-12-03 DIAGNOSIS — F4322 Adjustment disorder with anxiety: Secondary | ICD-10-CM

## 2022-12-03 NOTE — Patient Instructions (Addendum)
Center for [facility] at [facility] [address] [place], [place] [address] [phone] (main office) [phone] (Arnav Cregg's office)  Loss Support Groups: www.postpartum.net  [facility] (Individual and group grief support; Kids Path for children) [facility].Gerre Scull  [phone]    /Emotional Wellbeing Apps and Websites Here are a few free apps meant to help you to help yourself.  To find, try searching on the internet to see if the app is offered on Apple/Android devices. If your first choice doesn't come up on your device, the good news is that there are many choices! Play around with different apps to see which ones are helpful to you.    Calm This is an app meant to help increase calm feelings. Includes info, strategies, and tools for tracking your feelings.      Calm Harm  This app is meant to help with self-harm. Provides many 5-minute or 15-min coping strategies for doing instead of hurting yourself.       Healthy Minds Health Minds is a problem-solving tool to help deal with emotions and cope with stress you encounter wherever you are.      MindShift This app can help people cope with anxiety. Rather than trying to avoid anxiety, you can make an important shift and face it.      MY3  MY3 features a support system, safety plan and resources with the goal of offering a tool to use in a time of need.       My Life My Voice  This mood journal offers a simple solution for tracking your thoughts, feelings and moods. Animated emoticons can help identify your mood.       Relax Melodies Designed to help with sleep, on this app you can mix sounds and meditations for relaxation.      Smiling Mind Smiling Mind is meditation made easy: it's a simple tool that helps put a smile on your mind.        Stop, Breathe & Think  A friendly, simple guide for people through meditations for mindfulness and compassion.  Stop, Breathe and  Think Kids Enter your current feelings and choose a "mission" to help you cope. Offers videos for certain moods instead of just sound recordings.       Team Orange The goal of this tool is to help teens change how they think, act, and react. This app helps you focus on your own good feelings and experiences.      The [employer] Box The [employer] Box (VHB) contains simple tools to help patients with coping, relaxation, distraction, and positive thinking.

## 2022-12-04 ENCOUNTER — Encounter: Payer: Medicaid Other | Admitting: Family Medicine

## 2022-12-17 NOTE — BH Specialist Note (Signed)
Integrated Behavioral Health via Telemedicine Visit  12/17/2022 Betty Gentry [id number]  Less than 5 minutes by phone; pt requests to cancel today due to work schedule, and would like the following:   Schedule f/u appointment with Dr. Shawnie Pons to discuss changes to medication; "Lexapro is helping but not enough, maybe something else for anxiety". Pt is taking Lexapro, Buspar and Atarax, all as prescribed; feeling as if her "patience is non-existent and the stress and anxiety is overwhelming".   Pt is made aware that another option is to use [facility] walk-in to establish with psychiatry for outpatient; that [facility] will make Dr. Shawnie Pons aware.   Pt will call back at [phone] (Belton Peplinski's office) or [phone] (main office) as needed to schedule future integrated BH appointments.   Valetta Close Kc Sedlak, LCSW     12/03/2022    2:05 PM 08/12/2022    3:11 PM 01/04/2021    3:38 PM 12/17/2019    9:24 AM 10/17/2017    8:13 AM  Depression screen PHQ 2/9  Decreased Interest 1 0 0 0 0  Down, Depressed, Hopeless 1 0 0 0 0  PHQ - 2 Score 2 0 0 0 0  Altered sleeping 3 1  0   Tired, decreased energy 0 1  2   Change in appetite 1 0  1   Feeling bad or failure about yourself  1 0  0   Trouble concentrating 1 0  1   Moving slowly or fidgety/restless 0 0  0   Suicidal thoughts 0 0  0   PHQ-9 Score 8 2  4    Difficult doing work/chores    Somewhat difficult       12/03/2022    2:07 PM 08/12/2022    3:11 PM 12/17/2019    9:23 AM 12/09/2017    4:04 PM  GAD 7 : Generalized Anxiety Score  Nervous, Anxious, on Edge 3 1 2 2   Control/stop worrying 1 0 1 2  Worry too much - different things 1 0 1 2  Trouble relaxing 1 0 1 2  Restless 1 0 1 1  Easily annoyed or irritable 1 1 1 3   Afraid - awful might happen 0 0 1 1  Total GAD 7 Score 8 2 8 13   Anxiety Difficulty   Somewhat difficult

## 2022-12-18 ENCOUNTER — Encounter: Payer: Medicaid Other | Admitting: Obstetrics & Gynecology

## 2022-12-18 ENCOUNTER — Other Ambulatory Visit: Payer: Medicaid Other

## 2022-12-27 ENCOUNTER — Encounter: Payer: Self-pay | Admitting: Family Medicine

## 2022-12-31 ENCOUNTER — Ambulatory Visit: Payer: Medicaid Other | Admitting: Clinical

## 2022-12-31 DIAGNOSIS — F4322 Adjustment disorder with anxiety: Secondary | ICD-10-CM

## 2022-12-31 DIAGNOSIS — F4321 Adjustment disorder with depressed mood: Secondary | ICD-10-CM

## 2022-12-31 NOTE — Patient Instructions (Signed)
[facility] at [facility] [address] [place], [place] [address] [phone] (main office) [phone] (Jamie's office)  [facility]  [address], [place], [place] [address] [phone] or [phone] WALK-IN URGENT CARE 24/7 FOR ANYONE [address], [place], [place]  [phone] Fax: [phone] [email or web address] *Interpreters available *Accepts all insurance and uninsured for Urgent Care needs *Accepts Medicaid and uninsured for outpatient treatment (below)    ONLY FOR [place] Residents  Below:   Outpatient New Patient Assessment/Therapy Walk-ins:        Monday -Thursday 8am until slots are full.        Every Friday 1pm-4pm  (first come, first served)                   New Patient Psychiatry/Medication Management        Monday-Friday 8am-11am (first come, first served)              For all walk-ins we ask that you arrive by 7:15am, because patients will be seen in the order of arrival.     

## 2023-01-10 ENCOUNTER — Telehealth: Payer: Medicaid Other | Admitting: Family Medicine

## 2023-01-10 ENCOUNTER — Encounter: Payer: Self-pay | Admitting: Family Medicine

## 2023-01-10 DIAGNOSIS — Z8279 Family history of other congenital malformations, deformations and chromosomal abnormalities: Secondary | ICD-10-CM

## 2023-01-10 DIAGNOSIS — F419 Anxiety disorder, unspecified: Secondary | ICD-10-CM

## 2023-01-10 MED ORDER — BUSPIRONE HCL 7.5 MG PO TABS: ORAL_TABLET | ORAL | 3 refills | Status: DC

## 2023-01-10 MED ORDER — BUPROPION HCL ER (SR) 100 MG PO TB12
100.0000 mg | ORAL_TABLET | Freq: Two times a day (BID) | ORAL | 2 refills | Status: DC
Start: 2023-01-10 — End: 2023-03-04

## 2023-01-10 NOTE — Progress Notes (Signed)
GYNECOLOGY VIRTUAL VISIT ENCOUNTER NOTE  Provider location: [facility]   Patient location: Home  I connected with Betty Hai on 01/10/23 at  8:15 AM EDT by MyChart Video Encounter and verified that I am speaking with the correct person using two identifiers.   I discussed the limitations, risks, security and privacy concerns of performing an evaluation and management service virtually and the availability of in person appointments. I also discussed with the patient that there may be a patient responsible charge related to this service. The patient expressed understanding and agreed to proceed.   History:  Betty Gentry is a 37 y.o. [phone] female being evaluated today for Mood check/medication adjustment. She had  D&E in Early July for a fetal anomaly (encephalocele). She report lexapro that was started in July has been helping but notes continued irritability and impatience. She has tried zolfot in the past without good effect. She overall things the lexapro helped but continued to have more anxiety spectrum symptoms. Deneis depressive symptoms and SI. Marland Kitchen She denies any abnormal vaginal discharge, bleeding, pelvic pain or other concerns.       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   Fatigue 12/17/2019   Gastroesophageal reflux disease 10/17/2017   History of chicken pox    Knee pain 10/07/2017   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   Preterm labor 10/12/2018   Term pregnancy 11/17/2018   Uterine contractions during pregnancy 03/17/2017   Vaginal delivery 03/18/2017   Vaginal Pap smear, abnormal    Past Surgical History:  Procedure Laterality Date   BELPHAROPTOSIS REPAIR     KNEE SURGERY Right    x3   LEEP     TONSILLECTOMY     The following portions of the patient's history were reviewed and updated as appropriate: allergies, current  medications, past family history, past medical history, past social history, past surgical history and problem list.    Review of Systems:  Pertinent items noted in HPI and remainder of comprehensive ROS otherwise negative.  Physical Exam:   General:  Alert, oriented and cooperative. Patient appears to be in no acute distress.  Mental Status: Normal mood and affect. Normal behavior. Normal judgment and thought content.   Respiratory: Normal respiratory effort, no problems with respiration noted  Rest of physical exam deferred due to type of encounter  Labs and Imaging No results found for this or any previous visit (from the past 336 hour(s)). No results found.     Assessment and Plan:     1. Anxiety Discussed options for increasing Lexapro dose vs therapy change Reviewed Wellbutrin as SNRI to help with more anxiety spectrum symptoms Patient wants to try this We will cross titrate-- Lexapro 37m daily with Wellbutrin 50mg  BID x1 week. Patient will then stop Lexapro and being Wellbutrin 100mg  BID.  Goal will be to titrate up the wellbutrin and transition to XL version.  - buPROPion ER (WELLBUTRIN SR) 100 MG 12 hr tablet; Take 1 tablet (100 mg total) by mouth 2 (two) times daily.  Dispense: 60 tablet; Refill: 2 - busPIRone (BUSPAR) 7.5 MG tablet; TAKE 1 TABLET IN THE MORNING, AT NOON, IN THE EVENING AND AT BEDTIME  Dispense: 120 tablet; Refill: 3  2. Prior Pregnancy with Encephaloceole On folic acid       I discussed the assessment and treatment plan with the patient. The  patient was provided an opportunity to ask questions and all were answered. The patient agreed with the plan and demonstrated an understanding of the instructions.   The patient was advised to call back or seek an in-person evaluation/go to the ED if the symptoms worsen or if the condition fails to improve as anticipated.  I provided 15 minutes of face-to-face time during this encounter.  No future  appointments.   Federico Flake, MD Center for [employer], [facility]

## 2023-01-17 ENCOUNTER — Other Ambulatory Visit: Payer: Self-pay | Admitting: Family Medicine

## 2023-03-04 ENCOUNTER — Ambulatory Visit (INDEPENDENT_AMBULATORY_CARE_PROVIDER_SITE_OTHER): Payer: Medicaid Other | Admitting: Nurse Practitioner

## 2023-03-04 VITALS — BP 110/80 | HR 87 | Temp 98.2°F | Ht 62.0 in | Wt 131.0 lb

## 2023-03-04 DIAGNOSIS — F4321 Adjustment disorder with depressed mood: Secondary | ICD-10-CM | POA: Diagnosis not present

## 2023-03-04 DIAGNOSIS — F419 Anxiety disorder, unspecified: Secondary | ICD-10-CM

## 2023-03-04 DIAGNOSIS — Z0000 Encounter for general adult medical examination without abnormal findings: Secondary | ICD-10-CM

## 2023-03-04 DIAGNOSIS — M549 Dorsalgia, unspecified: Secondary | ICD-10-CM

## 2023-03-04 DIAGNOSIS — G8929 Other chronic pain: Secondary | ICD-10-CM

## 2023-03-04 DIAGNOSIS — Z13220 Encounter for screening for lipoid disorders: Secondary | ICD-10-CM | POA: Diagnosis not present

## 2023-03-04 DIAGNOSIS — Z131 Encounter for screening for diabetes mellitus: Secondary | ICD-10-CM | POA: Diagnosis not present

## 2023-03-04 DIAGNOSIS — Z1329 Encounter for screening for other suspected endocrine disorder: Secondary | ICD-10-CM

## 2023-03-04 HISTORY — DX: Encounter for general adult medical examination without abnormal findings: Z00.00

## 2023-03-04 MED ORDER — METHOCARBAMOL 750 MG PO TABS: 750.0000 mg | ORAL_TABLET | Freq: Four times a day (QID) | ORAL | 5 refills | Status: DC | PRN

## 2023-03-04 MED ORDER — BUPROPION HCL ER (XL) 300 MG PO TB24
300.0000 mg | ORAL_TABLET | Freq: Every day | ORAL | 3 refills | Status: DC
Start: 2023-03-04 — End: 2024-03-17

## 2023-03-04 NOTE — Progress Notes (Signed)
Bethanie Dicker, NP-C Phone: [phone]  Betty Gentry is a 37 y.o. female who presents today to establish care and for annual exam.   Discussed the use of AI scribe software for clinical note transcription with the patient, who gave verbal consent to proceed.  History of Present Illness   The patient, currently on Wellbutrin 100mg  twice a day, Buspar 7.5mg  three times a day, and Atarax as needed, presents with a significant history of anxiety. They also take folic acid two tablets twice a day. The patient reports that their mood has improved, but they still experience agitation at times. They express a preference to avoid medications such as clonazepam or Xanax, and are open to adjusting the Wellbutrin dosage if needed.  The patient also reports occasional constipation, particularly in the evenings, and uses methocarbamol for chronic back pain. They take this medication at least twice a day, but the exact frequency varies.  The patient has also been experiencing abdominal pain, which they believe may be related to ovulation. The pain is described as cramping, located above the pelvic bone, and lasts for about a day. The patient has noticed this pain occurring approximately two weeks after their menstrual cycle, which they believe aligns with their ovulation period.  The patient had a pregnancy loss in July due to encephalocele of fetus and has not visited their OBGYN since that time. They are considering trying for another pregnancy, and thus have not completely disengaged from their OBGYN's care. They have not scheduled a follow-up appointment with their OBGYN at this time. The patient's Pap smear is up to date.   The patient's diet primarily consists of processed foods due to time constraints, but they have been making an effort to eat at home more often. They do not engage in formal exercise but describe themselves as active. They have received three COVID-19 vaccines and are up to date on  their tetanus and flu vaccines. They do not smoke and drink alcohol approximately twice a month. They deny any illicit drug use.      Active Ambulatory Problems    Diagnosis Date Noted   Anxiety 08/16/2009   previous pregnancy with encephalocele 01/10/2023   Grief associated with loss of fetus 03/04/2023   Preventative health care 03/04/2023   Chronic back pain 03/04/2023   Resolved Ambulatory Problems    Diagnosis Date Noted   Uterine contractions during pregnancy 03/17/2017   Knee pain 10/07/2017   Gastroesophageal reflux disease 10/17/2017   Constipation 12/10/2017   Preterm labor 10/12/2018   Pregnant and not yet delivered in third trimester 10/13/2018   Term pregnancy 11/17/2018   Acute vaginitis 12/17/2019   Fatigue 12/17/2019   Encounter for medical examination to establish care 12/17/2019   Low back pain 01/04/2021   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   Encounter for surveillance of contraceptive pills 01/04/2021   Weight loss, unintentional 01/04/2021   Supervision of high risk pregnancy, antepartum 07/26/2022   Advanced maternal age in multigravida, first trimester 07/26/2022   Rh negative state in antepartum period 08/12/2022   Maternal hypotension syndrome in second trimester 09/11/2022   Encephalocele, fetal, affecting care of mother, antepartum 10/15/2022   Past Medical History:  Diagnosis Date   Allergy Child   History of chicken pox    Vaginal delivery 03/18/2017   Vaginal Pap smear, abnormal     Family History  Problem Relation Age of Onset   Asthma Mother    Anxiety disorder  Mother    Hypertension Father    High Cholesterol Father    Arthritis Father    Hyperlipidemia Father    Anxiety disorder Father    Arthritis Maternal Grandmother    Heart disease Maternal Grandmother    Heart attack Maternal Grandmother    Hyperlipidemia Maternal Grandmother    Miscarriages / Stillbirths Maternal Grandmother     Osteoporosis Maternal Grandmother    Fibromyalgia Maternal Grandmother    Vision loss Maternal Grandmother    Arthritis Maternal Grandfather    Heart attack Maternal Grandfather    Hypertension Maternal Grandfather    Hyperlipidemia Maternal Grandfather    Heart attack Paternal Grandmother    Lung cancer Paternal Grandmother    Arthritis Paternal Grandmother    Cancer Paternal Grandmother        Lung   Hyperlipidemia Paternal Grandmother    Heart disease Paternal Grandmother    Hypertension Paternal Grandmother    Miscarriages / Stillbirths Paternal Grandmother    Arthritis Paternal Grandfather    Hyperlipidemia Paternal Grandfather     Social History   Socioeconomic History   Marital status: Single    Spouse name: Not on file   Number of children: Not on file   Years of education: Not on file   Highest education level: Associate degree: occupational, Scientist, product/process development, or vocational program  Occupational History   Occupation: Associate Professor  Tobacco Use   Smoking status: Never   Smokeless tobacco: Never  Vaping Use   Vaping status: Former   Substances: CBD  Substance and Sexual Activity   Alcohol use: Not Currently    Alcohol/week: 1.0 standard drink of alcohol    Types: 1 Glasses of wine per week    Comment: socially   Drug use: No   Sexual activity: Yes    Partners: Male    Birth control/protection: None    Comment: Trying to conceive  Other Topics Concern   Not on file  Social History Narrative   New mama   Social Determinants of Health   Financial Resource Strain: Medium Risk (03/04/2023)   Overall Financial Resource Strain (CARDIA)    Difficulty of Paying Living Expenses: Somewhat hard  Food Insecurity: No Food Insecurity (03/04/2023)   Hunger Vital Sign    Worried About Running Out of Food in the Last Year: Never true    Ran Out of Food in the Last Year: Never true  Transportation Needs: No Transportation Needs (03/04/2023)   PRAPARE - Therapist, art (Medical): No    Lack of Transportation (Non-Medical): No  Physical Activity: Insufficiently Active (03/04/2023)   Exercise Vital Sign    Days of Exercise per Week: 2 days    Minutes of Exercise per Session: 20 min  Stress: Stress Concern Present (03/04/2023)   [employer] of Occupational Health - Occupational Stress Questionnaire    Feeling of Stress : Very much  Social Connections: Moderately Isolated (03/04/2023)   Social Connection and Isolation Panel [NHANES]    Frequency of Communication with Friends and Family: More than three times a week    Frequency of Social Gatherings with Friends and Family: Once a week    Attends Religious Services: Never    Database administrator or Organizations: No    Attends Banker Meetings: Not on file    Marital Status: Living with partner  Intimate Partner Violence: Not At Risk (10/12/2018)   Humiliation, Afraid, Rape, and Kick questionnaire    Fear  of Current or Ex-Partner: No    Emotionally Abused: No    Physically Abused: No    Sexually Abused: No    ROS  General:  Negative for unexplained weight loss, fever Skin: Negative for new or changing mole, sore that won't heal HEENT: Negative for trouble hearing, trouble seeing, ringing in ears, mouth sores, hoarseness, change in voice, dysphagia. CV:  Negative for chest pain, dyspnea, edema, palpitations Resp: Negative for cough, dyspnea, hemoptysis GI: Negative for nausea, vomiting, diarrhea, constipation, abdominal pain, melena, hematochezia. GU: Negative for dysuria, incontinence, urinary hesitance, hematuria, vaginal or penile discharge, polyuria, sexual difficulty, lumps in testicle or breasts MSK: Negative for muscle cramps or aches, joint pain or swelling Neuro: Negative for headaches, weakness, numbness, dizziness, passing out/fainting Psych: Negative for memory problems  Objective  Physical Exam Vitals:   03/04/23 1532  BP: 110/80  Pulse:  87  Temp: 98.2 F (36.8 C)  SpO2: 100%    BP Readings from Last 3 Encounters:  03/04/23 110/80  11/20/22 132/84  10/14/22 (!) 112/59   Wt Readings from Last 3 Encounters:  03/04/23 131 lb (59.4 kg)  10/09/22 133 lb (60.3 kg)  09/11/22 129 lb (58.5 kg)    Physical Exam Constitutional:      General: She is not in acute distress.    Appearance: Normal appearance.  HENT:     Head: Normocephalic.     Right Ear: Tympanic membrane normal.     Left Ear: Tympanic membrane normal.     Nose: Nose normal.     Mouth/Throat:     Mouth: Mucous membranes are moist.     Pharynx: Oropharynx is clear.  Eyes:     Conjunctiva/sclera: Conjunctivae normal.     Pupils: Pupils are equal, round, and reactive to light.  Neck:     Thyroid: No thyromegaly.  Cardiovascular:     Rate and Rhythm: Normal rate and regular rhythm.     Heart sounds: Normal heart sounds.  Pulmonary:     Effort: Pulmonary effort is normal.     Breath sounds: Normal breath sounds.  Abdominal:     General: Abdomen is flat. Bowel sounds are normal.     Palpations: Abdomen is soft. There is no mass.     Tenderness: There is no abdominal tenderness.  Musculoskeletal:        General: Normal range of motion.  Lymphadenopathy:     Cervical: No cervical adenopathy.  Skin:    General: Skin is warm and dry.     Findings: No rash.  Neurological:     General: No focal deficit present.     Mental Status: She is alert.  Psychiatric:        Mood and Affect: Mood normal.        Behavior: Behavior normal.    Assessment/Plan:   Preventative health care Assessment & Plan: Physical exam complete. Lab work as outlined. We will contact the patient with results. Her Pap smear is up to date. Her flu and tetanus vaccines are up to date. She has received 3 doses of the COVID vaccine and declines additional. Recommended follow ups with her dentist and ophthalmologist for annual exams. Encouraged to work on [employer] and exercise.  Return to care in 3 months, sooner if needed.   Orders: -     CBC with Differential/Platelet -     Comprehensive metabolic panel -     VITAMIN D 25 Hydroxy (Vit-D Deficiency, Fractures)  Anxiety Assessment & Plan:  Wellbutrin 100mg  twice daily, Buspar 7.5mg  three times daily, and Atarax as needed currently manage their anxiety and grief. They report improvement but still experience agitation. We will increase Wellbutrin to 300mg  extended release once daily, continue Buspar 7.5mg  three times daily and Atarax as needed, and plan a three-month follow-up to assess mood on the increased Wellbutrin dose. PHQ- 6 and GAD- 14 today. Counseled patient on common side effects. Encouraged to contact if worsening symptoms, unusual behavior changes or suicidal thoughts occur.   Orders: -     buPROPion HCl ER (XL); Take 1 tablet (300 mg total) by mouth daily.  Dispense: 90 tablet; Refill: 3  Grief associated with loss of fetus Assessment & Plan: See anxiety plan. Increasing Wellbutrin to XL form 300 mg daily.   Orders: -     buPROPion HCl ER (XL); Take 1 tablet (300 mg total) by mouth daily.  Dispense: 90 tablet; Refill: 3  Chronic back pain, unspecified back location, unspecified back pain laterality Assessment & Plan: Their chronic back pain is managed with Methocarbamol 750mg , taken at least twice daily. We will continue Methocarbamol 750mg  as needed for back pain and refill the Methocarbamol prescription.  Orders: -     Methocarbamol; Take 1 tablet (750 mg total) by mouth every 6 (six) hours as needed for muscle spasms.  Dispense: 120 tablet; Refill: 5  Thyroid disorder screen -     TSH  Lipid screening -     Lipid panel  Diabetes mellitus screening -     Hemoglobin A1c    Return in about 3 months (around 06/04/2023) for Anxiety/Depression.   Bethanie Dicker, NP-C [facility] [employer]

## 2023-03-04 NOTE — Assessment & Plan Note (Signed)
Physical exam complete. Lab work as outlined. We will contact the patient with results. Her Pap smear is up to date. Her flu and tetanus vaccines are up to date. She has received 3 doses of the COVID vaccine and declines additional. Recommended follow ups with her dentist and ophthalmologist for annual exams. Encouraged to work on [employer] and exercise. Return to care in 3 months, sooner if needed.

## 2023-03-04 NOTE — Assessment & Plan Note (Addendum)
See anxiety plan. Increasing Wellbutrin to XL form 300 mg daily.

## 2023-03-04 NOTE — Assessment & Plan Note (Signed)
Their chronic back pain is managed with Methocarbamol 750mg , taken at least twice daily. We will continue Methocarbamol 750mg  as needed for back pain and refill the Methocarbamol prescription.

## 2023-03-04 NOTE — Assessment & Plan Note (Signed)
Wellbutrin 100mg  twice daily, Buspar 7.5mg  three times daily, and Atarax as needed currently manage their anxiety and grief. They report improvement but still experience agitation. We will increase Wellbutrin to 300mg  extended release once daily, continue Buspar 7.5mg  three times daily and Atarax as needed, and plan a three-month follow-up to assess mood on the increased Wellbutrin dose. PHQ- 6 and GAD- 14 today. Counseled patient on common side effects. Encouraged to contact if worsening symptoms, unusual behavior changes or suicidal thoughts occur.

## 2023-03-05 LAB — COMPREHENSIVE METABOLIC PANEL
ALT: 16 U/L (ref 0–35)
AST: 20 U/L (ref 0–37)
Albumin: 4.5 g/dL (ref 3.5–5.2)
Alkaline Phosphatase: 81 U/L (ref 39–117)
BUN: 9 mg/dL (ref 6–23)
CO2: 27 meq/L (ref 19–32)
Calcium: 9.5 mg/dL (ref 8.4–10.5)
Chloride: 104 meq/L (ref 96–112)
Creatinine, Ser: 0.94 mg/dL (ref 0.40–1.20)
GFR: 78.3 mL/min (ref >60.00)
Glucose, Bld: 86 mg/dL (ref 70–99)
Potassium: 3.9 meq/L (ref 3.5–5.1)
Sodium: 137 meq/L (ref 135–145)
Total Bilirubin: 0.3 mg/dL (ref 0.2–1.2)
Total Protein: 7 g/dL (ref 6.0–8.3)

## 2023-03-05 LAB — LIPID PANEL
Cholesterol: 190 mg/dL (ref 0–200)
HDL: 39.3 mg/dL (ref >39.00)
LDL Cholesterol: 80 mg/dL (ref 0–99)
NonHDL: 151.17
Total CHOL/HDL Ratio: 5
Triglycerides: 357 mg/dL — ABNORMAL HIGH (ref 0.0–149.0)
VLDL: 71.4 mg/dL — ABNORMAL HIGH (ref 0.0–40.0)

## 2023-03-05 LAB — CBC WITH DIFFERENTIAL/PLATELET
Basophils Absolute: 0.1 10*3/uL (ref 0.0–0.1)
Basophils Relative: 1.1 % (ref 0.0–3.0)
Eosinophils Absolute: 0.3 10*3/uL (ref 0.0–0.7)
Eosinophils Relative: 3.4 % (ref 0.0–5.0)
HCT: 41.8 % (ref 36.0–46.0)
Hemoglobin: 14.2 g/dL (ref 12.0–15.0)
Lymphocytes Relative: 30.7 % (ref 12.0–46.0)
Lymphs Abs: 2.3 10*3/uL (ref 0.7–4.0)
MCHC: 33.9 g/dL (ref 30.0–36.0)
MCV: 87 fL (ref 78.0–100.0)
Monocytes Absolute: 0.5 10*3/uL (ref 0.1–1.0)
Monocytes Relative: 6.5 % (ref 3.0–12.0)
Neutro Abs: 4.5 10*3/uL (ref 1.4–7.7)
Neutrophils Relative %: 58.3 % (ref 43.0–77.0)
Platelets: 325 10*3/uL (ref 150.0–400.0)
RBC: 4.8 Mil/uL (ref 3.87–5.11)
RDW: 13.3 % (ref 11.5–15.5)
WBC: 7.6 10*3/uL (ref 4.0–10.5)

## 2023-03-05 LAB — HEMOGLOBIN A1C: Hgb A1c MFr Bld: 5.3 % (ref 4.6–6.5)

## 2023-03-05 LAB — TSH: TSH: 1.34 u[IU]/mL (ref 0.35–5.50)

## 2023-03-05 LAB — VITAMIN D 25 HYDROXY (VIT D DEFICIENCY, FRACTURES): VITD: 38.38 ng/mL (ref 30.00–100.00)

## 2023-03-06 ENCOUNTER — Ambulatory Visit: Payer: Self-pay

## 2023-03-09 ENCOUNTER — Inpatient Hospital Stay (HOSPITAL_COMMUNITY): Admission: AD | Admit: 2023-03-09 | Payer: Medicaid Other | Source: Home / Self Care

## 2023-03-17 ENCOUNTER — Other Ambulatory Visit: Payer: Self-pay | Admitting: Family Medicine

## 2023-03-19 ENCOUNTER — Telehealth: Payer: Medicaid Other | Admitting: Physician Assistant

## 2023-03-19 DIAGNOSIS — J019 Acute sinusitis, unspecified: Secondary | ICD-10-CM | POA: Diagnosis not present

## 2023-03-19 DIAGNOSIS — R110 Nausea: Secondary | ICD-10-CM

## 2023-03-19 DIAGNOSIS — B9689 Other specified bacterial agents as the cause of diseases classified elsewhere: Secondary | ICD-10-CM | POA: Diagnosis not present

## 2023-03-19 MED ORDER — HYDROXYZINE HCL 10 MG PO TABS: 10.0000 mg | ORAL_TABLET | Freq: Three times a day (TID) | ORAL | 0 refills | Status: DC | PRN

## 2023-03-19 MED ORDER — AMOXICILLIN-POT CLAVULANATE 875-125 MG PO TABS: 1.0000 | ORAL_TABLET | Freq: Two times a day (BID) | ORAL | 0 refills | Status: DC

## 2023-03-19 MED ORDER — ONDANSETRON 4 MG PO TBDP
4.0000 mg | ORAL_TABLET | Freq: Three times a day (TID) | ORAL | 0 refills | Status: DC | PRN
Start: 2023-03-19 — End: 2023-06-04

## 2023-03-19 NOTE — Patient Instructions (Signed)
[name], thank you for joining [name], PA-C for today's virtual visit.  While this provider is not your primary care provider (PCP), if your PCP is located in our provider database this encounter information will be shared with them immediately following your visit.   A [facility] MyChart account gives you access to today's visit and all your visits, tests, and labs performed at [facility] " click here if you don't have a [facility] MyChart account or go to [email or web address]  Consent: (Patient) KIRSTEE ZETTS provided verbal consent for this virtual visit at the beginning of the encounter.  Current Medications:  Current Outpatient Medications:    amoxicillin-clavulanate (AUGMENTIN) 875-125 MG tablet, Take 1 tablet by mouth 2 (two) times daily., Disp: 20 tablet, Rfl: 0   ondansetron (ZOFRAN-ODT) 4 MG disintegrating tablet, Take 1 tablet (4 mg total) by mouth every 8 (eight) hours as needed., Disp: 20 tablet, Rfl: 0   buPROPion (WELLBUTRIN XL) 300 MG 24 hr tablet, Take 1 tablet (300 mg total) by mouth daily., Disp: 90 tablet, Rfl: 3   busPIRone (BUSPAR) 7.5 MG tablet, TAKE 1 TABLET IN THE MORNING, AT NOON, IN THE EVENING AND AT BEDTIME, Disp: 120 tablet, Rfl: 3   folic acid (FOLVITE) 1 MG tablet, Take 4 tablets (4 mg total) by mouth daily., Disp: 180 tablet, Rfl: 3   hydrOXYzine (ATARAX) 10 MG tablet, TAKE ONE TABLET (10 MG TOTAL) BY MOUTH THREE TIMES DAILY AS NEEDED., Disp: 30 tablet, Rfl: 0   methocarbamol (ROBAXIN) 750 MG tablet, Take 1 tablet (750 mg total) by mouth every 6 (six) hours as needed for muscle spasms., Disp: 120 tablet, Rfl: 5   Medications ordered in this encounter:  Meds ordered this encounter  Medications   amoxicillin-clavulanate (AUGMENTIN) 875-125 MG tablet    Sig: Take 1 tablet by mouth 2 (two) times daily.    Dispense:  20 tablet    Refill:  0    Order Specific Question:   Supervising Provider    Answer:    LAMPTEY, PHILIP O [[id number]]   ondansetron (ZOFRAN-ODT) 4 MG disintegrating tablet    Sig: Take 1 tablet (4 mg total) by mouth every 8 (eight) hours as needed.    Dispense:  20 tablet    Refill:  0    Order Specific Question:   Supervising Provider    Answer:   Merrilee Jansky [id number]     *If you need refills on other medications prior to your next appointment, please contact your pharmacy*  Follow-Up: Call back or seek an in-person evaluation if the symptoms worsen or if the condition fails to improve as anticipated.  [facility] [phone]  Other Instructions Sinus Infection, Adult A sinus infection, also called sinusitis, is inflammation of your sinuses. Sinuses are hollow spaces in the bones around your face. Your sinuses are located: Around your eyes. In the middle of your forehead. Behind your nose. In your cheekbones. Mucus normally drains out of your sinuses. When your nasal tissues become inflamed or swollen, mucus can become trapped or blocked. This allows bacteria, viruses, and fungi to grow, which leads to infection. Most infections of the sinuses are caused by a virus. A sinus infection can develop quickly. It can last for up to 4 weeks (acute) or for more than 12 weeks (chronic). A sinus infection often develops after a cold. What are the causes? This condition is caused by anything that creates swelling in the sinuses or  stops mucus from draining. This includes: Allergies. Asthma. Infection from bacteria or viruses. Deformities or blockages in your nose or sinuses. Abnormal growths in the nose (nasal polyps). Pollutants, such as chemicals or irritants in the air. Infection from fungi. This is rare. What increases the risk? You are more likely to develop this condition if you: Have a weak body defense system (immune system). Do a lot of swimming or diving. Overuse nasal sprays. Smoke. What are the signs or symptoms? The main symptoms of this  condition are pain and a feeling of pressure around the affected sinuses. Other symptoms include: Stuffy nose or congestion that makes it difficult to breathe through your nose. Thick yellow or greenish drainage from your nose. Tenderness, swelling, and warmth over the affected sinuses. A cough that may get worse at night. Decreased sense of smell and taste. Extra mucus that collects in the throat or the back of the nose (postnasal drip) causing a sore throat or bad breath. Tiredness (fatigue). Fever. How is this diagnosed? This condition is diagnosed based on: Your symptoms. Your medical history. A physical exam. Tests to find out if your condition is acute or chronic. This may include: Checking your nose for nasal polyps. Viewing your sinuses using a device that has a light (endoscope). Testing for allergies or bacteria. Imaging tests, such as an MRI or CT scan. In rare cases, a bone biopsy may be done to rule out more serious types of fungal sinus disease. How is this treated? Treatment for a sinus infection depends on the cause and whether your condition is chronic or acute. If caused by a virus, your symptoms should go away on their own within 10 days. You may be given medicines to relieve symptoms. They include: Medicines that shrink swollen nasal passages (decongestants). A spray that eases inflammation of the nostrils (topical intranasal corticosteroids). Rinses that help get rid of thick mucus in your nose (nasal saline washes). Medicines that treat allergies (antihistamines). Over-the-counter pain relievers. If caused by bacteria, your health care provider may recommend waiting to see if your symptoms improve. Most bacterial infections will get better without antibiotic medicine. You may be given antibiotics if you have: A severe infection. A weak immune system. If caused by narrow nasal passages or nasal polyps, surgery may be needed. Follow these instructions at  home: Medicines Take, use, or apply over-the-counter and prescription medicines only as told by your health care provider. These may include nasal sprays. If you were prescribed an antibiotic medicine, take it as told by your health care provider. Do not stop taking the antibiotic even if you start to feel better. Hydrate and humidify  Drink enough fluid to keep your urine pale yellow. Staying hydrated will help to thin your mucus. Use a cool mist humidifier to keep the humidity level in your home above 50%. Inhale steam for 10-15 minutes, 3-4 times a day, or as told by your health care provider. You can do this in the bathroom while a hot shower is running. Limit your exposure to cool or dry air. Rest Rest as much as possible. Sleep with your head raised (elevated). Make sure you get enough sleep each night. General instructions  Apply a warm, moist washcloth to your face 3-4 times a day or as told by your health care provider. This will help with discomfort. Use nasal saline washes as often as told by your health care provider. Wash your hands often with soap and water to reduce your exposure to germs.  If soap and water are not available, use hand sanitizer. Do not smoke. Avoid being around people who are smoking (secondhand smoke). Keep all follow-up visits. This is important. Contact a health care provider if: You have a fever. Your symptoms get worse. Your symptoms do not improve within 10 days. Get help right away if: You have a severe headache. You have persistent vomiting. You have severe pain or swelling around your face or eyes. You have vision problems. You develop confusion. Your neck is stiff. You have trouble breathing. These symptoms may be an emergency. Get help right away. Call 911. Do not wait to see if the symptoms will go away. Do not drive yourself to the hospital. Summary A sinus infection is soreness and inflammation of your sinuses. Sinuses are hollow  spaces in the bones around your face. This condition is caused by nasal tissues that become inflamed or swollen. The swelling traps or blocks the flow of mucus. This allows bacteria, viruses, and fungi to grow, which leads to infection. If you were prescribed an antibiotic medicine, take it as told by your health care provider. Do not stop taking the antibiotic even if you start to feel better. Keep all follow-up visits. This is important. This information is not intended to replace advice given to you by your health care provider. Make sure you discuss any questions you have with your health care provider. Document Revised: 03/13/2021 Document Reviewed: 03/13/2021 Elsevier Patient Education  2024 [employer]    If you have been instructed to have an in-person evaluation today at a local Urgent Care facility, please use the link below. It will take you to a list of all of our available [facility], including address, phone number and hours of operation. Please do not delay care.  [facility]  If you or a family member do not have a primary care provider, use the link below to schedule a visit and establish care. When you choose a [facility] primary care physician or advanced practice provider, you gain a long-term partner in health. Find a Primary Care Provider  Learn more about [facility] in-office and virtual care options: [facility] - Get Care Now

## 2023-03-19 NOTE — Progress Notes (Signed)
Virtual Visit Consent   Betty Gentry, you are scheduled for a virtual visit with a [facility] provider today. Just as with appointments in the office, your consent must be obtained to participate. Your consent will be active for this visit and any virtual visit you may have with one of our providers in the next 365 days. If you have a MyChart account, a copy of this consent can be sent to you electronically.  As this is a virtual visit, video technology does not allow for your provider to perform a traditional examination. This may limit your provider's ability to fully assess your condition. If your provider identifies any concerns that need to be evaluated in person or the need to arrange testing (such as labs, EKG, etc.), we will make arrangements to do so. Although advances in technology are sophisticated, we cannot ensure that it will always work on either your end or our end. If the connection with a video visit is poor, the visit may have to be switched to a telephone visit. With either a video or telephone visit, we are not always able to ensure that we have a secure connection.  By engaging in this virtual visit, you consent to the provision of healthcare and authorize for your insurance to be billed (if applicable) for the services provided during this visit. Depending on your insurance coverage, you may receive a charge related to this service.  I need to obtain your verbal consent now. Are you willing to proceed with your visit today? Betty Gentry has provided verbal consent on 03/19/2023 for a virtual visit (video or telephone). [name], PA-C  Date: 03/19/2023 11:06 AM  Virtual Visit via Video Note   I, [name], connected with  Betty Gentry  ([id number], 04/29/1985) on 03/19/23 at 11:00 AM EST by a video-enabled telemedicine application and verified that I am speaking with the correct person using two identifiers.  Location: Patient:  Virtual Visit Location Patient: Home Provider: Virtual Visit Location Provider: Home Office   I discussed the limitations of evaluation and management by telemedicine and the availability of in person appointments. The patient expressed understanding and agreed to proceed.    History of Present Illness: Betty Gentry is a 37 y.o. who identifies as a female who was assigned female at birth, and is being seen today for possible sinus infection.  HPI: Sinusitis This is a new problem. The current episode started 1 to 4 weeks ago (3 weeks; waxing and waning). The problem has been waxing and waning since onset. There has been no fever. She is experiencing no pain. Associated symptoms include congestion, ear pain (left sided ear fullness and popping), headaches and sinus pressure (left sided). Pertinent negatives include no chills, coughing, diaphoresis, hoarse voice, shortness of breath, sore throat or swollen glands. (Left upper jaw pain) Treatments tried: sudafed, zpack (took old zpack she had and symptoms improved but returned within a day or two) The treatment provided no relief.     Problems:  Patient Active Problem List   Diagnosis Date Noted   Grief associated with loss of fetus 03/04/2023   Preventative health care 03/04/2023   Chronic back pain 03/04/2023   previous pregnancy with encephalocele 01/10/2023   Anxiety 08/16/2009    Allergies:  Allergies  Allergen Reactions   Erythromycin Nausea Only and Nausea And Vomiting   Sulfa Antibiotics Nausea Only, Nausea And Vomiting and Other (See Comments)    Other Reaction(s): Not available  Tetracyclines & Related Nausea Only, Nausea And Vomiting and Other (See Comments)    Other Reaction(s): Not available   Nsaids    Medications:  Current Outpatient Medications:    amoxicillin-clavulanate (AUGMENTIN) 875-125 MG tablet, Take 1 tablet by mouth 2 (two) times daily., Disp: 20 tablet, Rfl: 0   ondansetron (ZOFRAN-ODT) 4 MG  disintegrating tablet, Take 1 tablet (4 mg total) by mouth every 8 (eight) hours as needed., Disp: 20 tablet, Rfl: 0   buPROPion (WELLBUTRIN XL) 300 MG 24 hr tablet, Take 1 tablet (300 mg total) by mouth daily., Disp: 90 tablet, Rfl: 3   busPIRone (BUSPAR) 7.5 MG tablet, TAKE 1 TABLET IN THE MORNING, AT NOON, IN THE EVENING AND AT BEDTIME, Disp: 120 tablet, Rfl: 3   folic acid (FOLVITE) 1 MG tablet, Take 4 tablets (4 mg total) by mouth daily., Disp: 180 tablet, Rfl: 3   hydrOXYzine (ATARAX) 10 MG tablet, TAKE ONE TABLET (10 MG TOTAL) BY MOUTH THREE TIMES DAILY AS NEEDED., Disp: 30 tablet, Rfl: 0   methocarbamol (ROBAXIN) 750 MG tablet, Take 1 tablet (750 mg total) by mouth every 6 (six) hours as needed for muscle spasms., Disp: 120 tablet, Rfl: 5  Observations/Objective: Patient is well-developed, well-nourished in no acute distress.  Resting comfortably at home.  Head is normocephalic, atraumatic.  No labored breathing.  Speech is clear and coherent with logical content.  Patient is alert and oriented at baseline.    Assessment and Plan: 1. Acute bacterial sinusitis - amoxicillin-clavulanate (AUGMENTIN) 875-125 MG tablet; Take 1 tablet by mouth 2 (two) times daily.  Dispense: 20 tablet; Refill: 0  2. Nausea - ondansetron (ZOFRAN-ODT) 4 MG disintegrating tablet; Take 1 tablet (4 mg total) by mouth every 8 (eight) hours as needed.  Dispense: 20 tablet; Refill: 0  - Worsening symptoms that have not responded to OTC medications.  - Will give Augmentin - Zofran for nausea that is caused by antibiotics (common side effect for her) - Continue allergy medications.  - Steam and humidifier can help - Stay well hydrated and get plenty of rest.  - Seek in person evaluation if no symptom improvement or if symptoms worsen   Follow Up Instructions: I discussed the assessment and treatment plan with the patient. The patient was provided an opportunity to ask questions and all were answered. The  patient agreed with the plan and demonstrated an understanding of the instructions.  A copy of instructions were sent to the patient via MyChart unless otherwise noted below.    The patient was advised to call back or seek an in-person evaluation if the symptoms worsen or if the condition fails to improve as anticipated.    [name], PA-C

## 2023-04-29 ENCOUNTER — Ambulatory Visit (INDEPENDENT_AMBULATORY_CARE_PROVIDER_SITE_OTHER): Payer: Medicaid Other | Admitting: Family Medicine

## 2023-04-29 ENCOUNTER — Ambulatory Visit: Payer: Self-pay | Admitting: Nurse Practitioner

## 2023-04-29 VITALS — BP 110/78 | HR 91 | Temp 98.0°F | Resp 16 | Ht 62.0 in | Wt 131.2 lb

## 2023-04-29 DIAGNOSIS — H6993 Unspecified Eustachian tube disorder, bilateral: Secondary | ICD-10-CM

## 2023-04-29 DIAGNOSIS — U071 COVID-19: Secondary | ICD-10-CM | POA: Diagnosis not present

## 2023-04-29 DIAGNOSIS — J029 Acute pharyngitis, unspecified: Secondary | ICD-10-CM

## 2023-04-29 LAB — POCT INFLUENZA A/B
Influenza A, POC: NEGATIVE
Influenza B, POC: NEGATIVE

## 2023-04-29 LAB — POCT RAPID STREP A (OFFICE): Rapid Strep A Screen: NEGATIVE

## 2023-04-29 LAB — POC COVID19 BINAXNOW: SARS Coronavirus 2 Ag: POSITIVE — AB

## 2023-04-29 MED ORDER — PROMETHAZINE-DM 6.25-15 MG/5ML PO SYRP: 5.0000 mL | ORAL_SOLUTION | Freq: Four times a day (QID) | ORAL | 0 refills | Status: DC | PRN

## 2023-04-29 MED ORDER — PREDNISONE 20 MG PO TABS: 40.0000 mg | ORAL_TABLET | Freq: Every day | ORAL | 0 refills | Status: AC

## 2023-04-29 NOTE — Telephone Encounter (Signed)
  Chief Complaint: sore throat Symptoms: sore throat, fever, cough, congestion, headache, ear ache Frequency: since yesterday Pertinent Negatives: Patient denies SOB, nausea, vomiting, diarrhea, difficulty controlling secretions Disposition: [] ED /[] Urgent Care (no appt availability in office) / [x] Appointment(In office/virtual)/ []  [facility]/ [] Home Care/ [] Refused Recommended Disposition /[] [facility]/ []  Follow-up with PCP Additional Notes: Patient called stating she has had sore throat, congestion, headache, earache, fever and cough since yesterday. Reports fever of 102F relieved with tylenol . Per protocol, pt to be evaluated within 24 hours. Pt scheduled this morning for 0830 at [facility] per pt request. Care advice reviewed, verbalizes understanding. Alerting PCP for review.   Copied from CRM [id number]. Topic: Clinical - Red Word Triage >> Apr 29, 2023  7:40 AM Eleanor C wrote: [facility] that prompted transfer to Nurse Triage: fever of 102, cough, congestion, sore throat, symptoms started last night Reason for Disposition  Earache also present  Answer Assessment - Initial Assessment Questions 1. ONSET: When did the throat start hurting? (Hours or days ago)      Yesterday 2. SEVERITY: How bad is the sore throat? (Scale 1-10; mild, moderate or severe)   - MILD (1-3):  Doesn't interfere with eating or normal activities.   - MODERATE (4-7): Interferes with eating some solids and normal activities.   - SEVERE (8-10):  Excruciating pain, interferes with most normal activities.   - SEVERE WITH DYSPHAGIA (10): Can't swallow liquids, drooling.     3/10 3. STREP EXPOSURE: Has there been any exposure to strep within the past week? If Yes, ask: What type of contact occurred?      Unsure 4.  VIRAL SYMPTOMS: Are there any symptoms of a cold, such as a runny nose, cough, hoarse voice or red eyes?      Fever, congestion, nonproductive cough, hoarse voice 5. FEVER:  Do you have a fever? If Yes, ask: What is your temperature, how was it measured, and when did it start?     Fever last 102F last night, resolved with tylenol  6. PUS ON THE TONSILS: Is there pus on the tonsils in the back of your throat?     Has not looked 7. OTHER SYMPTOMS: Do you have any other symptoms? (e.g., difficulty breathing, headache, rash)     Headache, earache 8. PREGNANCY: Is there any chance you are pregnant? When was your last menstrual period?     Denies  Protocols used: Sore Throat-A-AH

## 2023-04-29 NOTE — Progress Notes (Signed)
 Chief Complaint  Patient presents with   Sore Throat    Sore throat    Betty Gentry here for URI complaints.  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   Fatigue 12/17/2019   Gastroesophageal reflux disease 10/17/2017   History of chicken pox    Knee pain 10/07/2017   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   Preterm labor 10/12/2018   Preventative health care 03/04/2023   Term pregnancy 11/17/2018   Uterine contractions during pregnancy 03/17/2017   Vaginal delivery 03/18/2017   Vaginal Pap smear, abnormal     Objective BP 110/78   Pulse 91   Temp 98 F (36.7 C) (Oral)   Resp 16   Ht 5' 2 (1.575 m)   Wt 131 lb 3.2 oz (59.5 kg)   SpO2 98%   BMI 24.00 kg/m  General: Awake, alert, appears stated age HEENT: AT, [place], ears patent b/l and TM's neg, nares patent w/o discharge, pharynx pink and without exudates, MMM, no sinus ttp Neck: No masses or asymmetry Heart: RRR Lungs: CTAB, no accessory muscle use Psych: Age appropriate judgment and insight, normal mood and affect  COVID-19 - Plan: promethazine -dextromethorphan (PROMETHAZINE -DM) 6.25-15 MG/5ML syrup  Dysfunction of both eustachian tubes - Plan: predniSONE  (DELTASONE ) 20 MG tablet  COVID +; reports someone did come in with covid at work and thinks she got from then. Young and healthy mostly, will hold on antiviral. 5 d pred burst 40 mg/d for ETD. Discussed quarantining recs and provided in AVS. Continue to push fluids, practice good hand hygiene, cover mouth when coughing. F/u prn. If starting to experience irreplaceable fluid loss, shaking, or  shortness of breath, seek immediate care. Pt voiced understanding and agreement to the plan.  [name] [place], DO 04/29/23 8:58 AM

## 2023-04-29 NOTE — Addendum Note (Signed)
 Addended by: Kathi Ludwig on: 04/29/2023 09:49 AM   Modules accepted: Orders

## 2023-04-29 NOTE — Patient Instructions (Addendum)
 Continue to push fluids, practice good hand hygiene, and cover your mouth if you cough.  If you start having irreplaceable fluid loss, shaking or shortness of breath, seek immediate care.  OK to take Tylenol  1000 mg (2 extra strength tabs) or 975 mg (3 regular strength tabs) every 6 hours as needed.  The recommendations suggest returning to normal activities when, for at least 24 hours, symptoms are improving overall, and if a fever was present, it has been gone without use of a fever-reducing medication. Wear a mask and try to social distance from people for the next 5 days.   Let us  know if you need anything.

## 2023-04-30 ENCOUNTER — Other Ambulatory Visit: Payer: Self-pay | Admitting: Family Medicine

## 2023-04-30 ENCOUNTER — Encounter: Payer: Self-pay | Admitting: Family Medicine

## 2023-04-30 MED ORDER — HYDROCODONE BIT-HOMATROP MBR 5-1.5 MG/5ML PO SOLN: 5.0000 mL | Freq: Three times a day (TID) | ORAL | 0 refills | Status: DC | PRN

## 2023-05-09 ENCOUNTER — Other Ambulatory Visit: Payer: Self-pay | Admitting: Family Medicine

## 2023-05-18 ENCOUNTER — Telehealth: Payer: Medicaid Other | Admitting: Family Medicine

## 2023-05-18 DIAGNOSIS — B3731 Acute candidiasis of vulva and vagina: Secondary | ICD-10-CM | POA: Diagnosis not present

## 2023-05-18 MED ORDER — FLUCONAZOLE 150 MG PO TABS: 150.0000 mg | ORAL_TABLET | ORAL | 0 refills | Status: DC

## 2023-05-18 NOTE — Patient Instructions (Signed)

## 2023-05-18 NOTE — Progress Notes (Signed)
Virtual Visit Consent   Betty Gentry, you are scheduled for a virtual visit with a [facility] provider today. Just as with appointments in the office, your consent must be obtained to participate. Your consent will be active for this visit and any virtual visit you may have with one of our providers in the next 365 days. If you have a MyChart account, a copy of this consent can be sent to you electronically.  As this is a virtual visit, video technology does not allow for your provider to perform a traditional examination. This may limit your provider's ability to fully assess your condition. If your provider identifies any concerns that need to be evaluated in person or the need to arrange testing (such as labs, EKG, etc.), we will make arrangements to do so. Although advances in technology are sophisticated, we cannot ensure that it will always work on either your end or our end. If the connection with a video visit is poor, the visit may have to be switched to a telephone visit. With either a video or telephone visit, we are not always able to ensure that we have a secure connection.  By engaging in this virtual visit, you consent to the provision of healthcare and authorize for your insurance to be billed (if applicable) for the services provided during this visit. Depending on your insurance coverage, you may receive a charge related to this service.  I need to obtain your verbal consent now. Are you willing to proceed with your visit today? Betty Gentry has provided verbal consent on 05/18/2023 for a virtual visit (video or telephone). Georgana Curio, FNP  Date: 05/18/2023 12:05 PM  Virtual Visit via Video Note   I, Georgana Curio, connected with  Betty Gentry  ([id number], October 09, 1985) on 05/18/23 at 12:00 PM EST by a video-enabled telemedicine application and verified that I am speaking with the correct person using two identifiers.  Location: Patient: Virtual Visit Location  Patient: Home Provider: Virtual Visit Location Provider: Home Office   I discussed the limitations of evaluation and management by telemedicine and the availability of in person appointments. The patient expressed understanding and agreed to proceed.    History of Present Illness: Betty Gentry is a 38 y.o. who identifies as a female who was assigned female at birth, and is being seen today for vaginal discharge thick white with itching for several days. No abd pain or fever. Marland Kitchen  HPI: HPI  Problems:  Patient Active Problem List   Diagnosis Date Noted   Grief associated with loss of fetus 03/04/2023   Preventative health care 03/04/2023   Chronic back pain 03/04/2023   previous pregnancy with encephalocele 01/10/2023   Anxiety 08/16/2009    Allergies:  Allergies  Allergen Reactions   Erythromycin Nausea Only and Nausea And Vomiting   Sulfa Antibiotics Nausea Only, Nausea And Vomiting and Other (See Comments)    Other Reaction(s): Not available   Tetracyclines & Related Nausea Only, Nausea And Vomiting and Other (See Comments)    Other Reaction(s): Not available   Nsaids    Medications:  Current Outpatient Medications:    fluconazole (DIFLUCAN) 150 MG tablet, Take 1 tablet (150 mg total) by mouth as directed for 2 doses., Disp: 2 tablet, Rfl: 0   buPROPion (WELLBUTRIN XL) 300 MG 24 hr tablet, Take 1 tablet (300 mg total) by mouth daily., Disp: 90 tablet, Rfl: 3   busPIRone (BUSPAR) 7.5 MG tablet, TAKE 1 TABLET IN THE MORNING,  AT NOON, IN THE EVENING AND AT BEDTIME, Disp: 120 tablet, Rfl: 3   folic acid (FOLVITE) 1 MG tablet, Take 4 tablets (4 mg total) by mouth daily., Disp: 180 tablet, Rfl: 3   HYDROcodone bit-homatropine (HYCODAN) 5-1.5 MG/5ML syrup, Take 5 mLs by mouth every 8 (eight) hours as needed for cough., Disp: 120 mL, Rfl: 0   hydrOXYzine (ATARAX) 10 MG tablet, Take 1 tablet (10 mg total) by mouth 3 (three) times daily as needed for anxiety., Disp: 30 tablet, Rfl: 0    methocarbamol (ROBAXIN) 750 MG tablet, Take 1 tablet (750 mg total) by mouth every 6 (six) hours as needed for muscle spasms., Disp: 120 tablet, Rfl: 5   ondansetron (ZOFRAN-ODT) 4 MG disintegrating tablet, Take 1 tablet (4 mg total) by mouth every 8 (eight) hours as needed., Disp: 20 tablet, Rfl: 0  Observations/Objective: Patient is well-developed, well-nourished in no acute distress.  Resting comfortably  at home.  Head is normocephalic, atraumatic.  No labored breathing.  Speech is clear and coherent with logical content.  Patient is alert and oriented at baseline.    Assessment and Plan: 1. Candidiasis of vagina (Primary)  Follow up with GYN as needed.   Follow Up Instructions: I discussed the assessment and treatment plan with the patient. The patient was provided an opportunity to ask questions and all were answered. The patient agreed with the plan and demonstrated an understanding of the instructions.  A copy of instructions were sent to the patient via MyChart unless otherwise noted below.     The patient was advised to call back or seek an in-person evaluation if the symptoms worsen or if the condition fails to improve as anticipated.    Georgana Curio, FNP

## 2023-06-04 ENCOUNTER — Ambulatory Visit: Payer: Medicaid Other | Admitting: Nurse Practitioner

## 2023-06-04 ENCOUNTER — Encounter: Payer: Self-pay | Admitting: Nurse Practitioner

## 2023-06-04 VITALS — BP 110/70 | HR 85 | Temp 98.2°F | Ht 62.0 in | Wt 131.2 lb

## 2023-06-04 DIAGNOSIS — E781 Pure hyperglyceridemia: Secondary | ICD-10-CM | POA: Insufficient documentation

## 2023-06-04 DIAGNOSIS — F419 Anxiety disorder, unspecified: Secondary | ICD-10-CM | POA: Diagnosis not present

## 2023-06-04 DIAGNOSIS — R413 Other amnesia: Secondary | ICD-10-CM

## 2023-06-04 DIAGNOSIS — F32A Depression, unspecified: Secondary | ICD-10-CM

## 2023-06-04 MED ORDER — ESCITALOPRAM OXALATE 10 MG PO TABS: 10.0000 mg | ORAL_TABLET | Freq: Every day | ORAL | 0 refills | Status: DC

## 2023-06-04 NOTE — Assessment & Plan Note (Signed)
There is noted improvement with increased Wellbutrin, but irritability and a short temper persist. PHQ- 6 and GAD- 13 today. Previous trials of Zoloft and Lexapro showed unclear benefits. Currently on Buspar 7.5 mg three times daily and Wellbutrin XL 300 mg daily, and using CBD gummies for anxiety management. We will add Lexapro 10 mg daily. She will continue Buspar and Wellbutrin. Counseled patient on common side effects. Encouraged to contact if worsening symptoms, unusual behavior changes or suicidal thoughts occur.

## 2023-06-04 NOTE — Assessment & Plan Note (Addendum)
Symptoms suggestive of ADHD include poor memory, difficulty focusing, and being "all over the place" at work. These may also relate to anxiety and mood disorder. Address the mood disorder first with Lexapro. Consider a formal evaluation for ADHD if symptoms persist despite treatment for mood.

## 2023-06-04 NOTE — Progress Notes (Signed)
 Betty Dicker, NP-C Phone: [phone]  Betty Gentry is a 38 y.o. female who presents today for follow up.  Discussed the use of AI scribe software for clinical note transcription with the patient, who gave verbal consent to proceed.  History of Present Illness   Betty Gentry is a 38 year old female with anxiety and depression who presents for a follow-up visit.  She has been experiencing anxiety and depression, with an increase in bupropion to 300 mg helping her stress levels. However, she continues to experience agitation and irritability, especially in situations involving her children. She describes having a 'short temper' and losing patience quickly. She is currently taking Buspar 7.5 mg three times a day. She has previously tried Zoloft, which was ineffective, and Lexapro, with unclear effects due to the time elapsed since last use. No chest pain, shortness of breath, or palpitations.  She has not been taking Atarax (hydroxyzine) as she ran out and is unsure of its effectiveness. Instead, she has been using CBD gummies, which she finds helpful for calming down during moments of heightened stress, although she is cautious about long-term use. She is not interested in quick-acting controlled substances for anxiety management.  She reports issues with short-term memory and focus, describing herself as 'all over the place' at work, which is exacerbated by being short-staffed. She has not been formally diagnosed with ADHD but notes that her symptoms overlap with anxiety and stress.  She has recently resumed birth control with Sprintec after deciding against trying for another child due to her current mental health state.      Social History   Tobacco Use  Smoking Status Never  Smokeless Tobacco Never    Current Outpatient Medications on File Prior to Visit  Medication Sig Dispense Refill   buPROPion (WELLBUTRIN XL) 300 MG 24 hr tablet Take 1 tablet (300 mg total) by mouth  daily. 90 tablet 3   busPIRone (BUSPAR) 7.5 MG tablet TAKE 1 TABLET IN THE MORNING, AT NOON, IN THE EVENING AND AT BEDTIME 120 tablet 3   folic acid (FOLVITE) 1 MG tablet Take 4 tablets (4 mg total) by mouth daily. 180 tablet 3   hydrOXYzine (ATARAX) 10 MG tablet Take 1 tablet (10 mg total) by mouth 3 (three) times daily as needed for anxiety. 30 tablet 0   methocarbamol (ROBAXIN) 750 MG tablet Take 1 tablet (750 mg total) by mouth every 6 (six) hours as needed for muscle spasms. 120 tablet 5   No current facility-administered medications on file prior to visit.    ROS see history of present illness  Objective  Physical Exam Vitals:   06/04/23 1313  BP: 110/70  Pulse: 85  Temp: 98.2 F (36.8 C)  SpO2: 100%    BP Readings from Last 3 Encounters:  06/04/23 110/70  04/29/23 110/78  03/04/23 110/80   Wt Readings from Last 3 Encounters:  06/04/23 131 lb 3.2 oz (59.5 kg)  04/29/23 131 lb 3.2 oz (59.5 kg)  03/04/23 131 lb (59.4 kg)    Physical Exam Constitutional:      General: She is not in acute distress.    Appearance: Normal appearance.  HENT:     Head: Normocephalic.  Cardiovascular:     Rate and Rhythm: Normal rate and regular rhythm.     Heart sounds: Normal heart sounds.  Pulmonary:     Effort: Pulmonary effort is normal.     Breath sounds: Normal breath sounds.  Skin:    General:  Skin is warm and dry.  Neurological:     General: No focal deficit present.     Mental Status: She is alert.  Psychiatric:        Mood and Affect: Mood normal.        Behavior: Behavior normal.    Assessment/Plan: Please see individual problem list.  Anxiety and depression Assessment & Plan: There is noted improvement with increased Wellbutrin, but irritability and a short temper persist. PHQ- 6 and GAD- 13 today. Previous trials of Zoloft and Lexapro showed unclear benefits. Currently on Buspar 7.5 mg three times daily and Wellbutrin XL 300 mg daily, and using CBD gummies for  anxiety management. We will add Lexapro 10 mg daily. She will continue Buspar and Wellbutrin. Counseled patient on common side effects. Encouraged to contact if worsening symptoms, unusual behavior changes or suicidal thoughts occur.   Orders: -     Escitalopram Oxalate; Take 1 tablet (10 mg total) by mouth daily.  Dispense: 90 tablet; Refill: 0  Poor short term memory Assessment & Plan: Symptoms suggestive of ADHD include poor memory, difficulty focusing, and being "all over the place" at work. These may also relate to anxiety and mood disorder. Address the mood disorder first with Lexapro. Consider a formal evaluation for ADHD if symptoms persist despite treatment for mood.     Return in about 6 weeks (around 07/16/2023) for Anxiety/Depression.   Betty Dicker, NP-C [facility]

## 2023-06-26 ENCOUNTER — Telehealth: Payer: Self-pay

## 2023-06-26 DIAGNOSIS — F419 Anxiety disorder, unspecified: Secondary | ICD-10-CM

## 2023-06-26 MED ORDER — BUSPIRONE HCL 7.5 MG PO TABS
ORAL_TABLET | ORAL | 3 refills | Status: DC
Start: 2023-06-26 — End: 2023-10-30

## 2023-06-26 NOTE — Telephone Encounter (Signed)
 Copied from CRM [id number]. Topic: Clinical - Prescription Issue >> Jun 26, 2023 12:28 PM Adele Barthel wrote: Reason for CRM:   Patient is calling in regarding several requests she has sent in for a refill of busPIRone (BUSPAR) 7.5 MG tablet. Advised she has 3 remaining refills at [facility]. She reports filling the medication on 01/10/2023, 04/02/2023, and 05/09/2023 and no longer has refills. Clinic may need to verify refills, patient is currently out of medication.  CB# [phone]

## 2023-06-26 NOTE — Addendum Note (Signed)
 Addended by: Donavan Foil on: 06/26/2023 04:33 PM   Modules accepted: Orders

## 2023-07-16 ENCOUNTER — Ambulatory Visit (INDEPENDENT_AMBULATORY_CARE_PROVIDER_SITE_OTHER): Payer: Medicaid Other | Admitting: Nurse Practitioner

## 2023-07-16 ENCOUNTER — Ambulatory Visit

## 2023-07-16 VITALS — BP 100/68 | HR 77 | Temp 98.5°F | Ht 62.0 in | Wt 127.0 lb

## 2023-07-16 DIAGNOSIS — N921 Excessive and frequent menstruation with irregular cycle: Secondary | ICD-10-CM | POA: Diagnosis not present

## 2023-07-16 DIAGNOSIS — F32A Depression, unspecified: Secondary | ICD-10-CM | POA: Diagnosis not present

## 2023-07-16 DIAGNOSIS — F419 Anxiety disorder, unspecified: Secondary | ICD-10-CM | POA: Diagnosis not present

## 2023-07-16 LAB — CBC WITH DIFFERENTIAL/PLATELET
Basophils Absolute: 0 10*3/uL (ref 0.0–0.1)
Basophils Relative: 0.5 % (ref 0.0–3.0)
Eosinophils Absolute: 0.2 10*3/uL (ref 0.0–0.7)
Eosinophils Relative: 4 % (ref 0.0–5.0)
HCT: 41.4 % (ref 36.0–46.0)
Hemoglobin: 13.8 g/dL (ref 12.0–15.0)
Lymphocytes Relative: 34.7 % (ref 12.0–46.0)
Lymphs Abs: 2.1 10*3/uL (ref 0.7–4.0)
MCHC: 33.2 g/dL (ref 30.0–36.0)
MCV: 89.4 fl (ref 78.0–100.0)
Monocytes Absolute: 0.4 10*3/uL (ref 0.1–1.0)
Monocytes Relative: 6.6 % (ref 3.0–12.0)
Neutro Abs: 3.3 10*3/uL (ref 1.4–7.7)
Neutrophils Relative %: 54.2 % (ref 43.0–77.0)
Platelets: 286 10*3/uL (ref 150.0–400.0)
RBC: 4.63 Mil/uL (ref 3.87–5.11)
RDW: 13.1 % (ref 11.5–15.5)
WBC: 6.1 10*3/uL (ref 4.0–10.5)

## 2023-07-16 MED ORDER — ESCITALOPRAM OXALATE 10 MG PO TABS: 10.0000 mg | ORAL_TABLET | Freq: Every day | ORAL | 2 refills | Status: DC

## 2023-07-16 NOTE — Assessment & Plan Note (Signed)
 Her mood is well-managed on Lexapro 10 mg daily, Wellbutrin XL 300 mg daily, and Buspar 7.5 mg three times daily, with a positive response to medication. PHQ- 5 and GAD- 4 today. Continue Lexapro, Wellbutrin, and Buspar. Refills sent. Encouraged to contact if worsening symptoms, unusual behavior changes or suicidal thoughts occur.

## 2023-07-16 NOTE — Progress Notes (Signed)
 Bethanie Dicker, NP-C Phone: [phone]  Betty Gentry is a 38 y.o. female who presents today for follow up.   Discussed the use of AI scribe software for clinical note transcription with the patient, who gave verbal consent to proceed.  History of Present Illness   Betty Gentry is a 38 year old female who presents with prolonged and heavy menstrual bleeding.  She has been experiencing prolonged and heavy menstrual bleeding since June 29, 2023, after missing one birth control pill on June 26, 2023. The bleeding persisted despite resuming her birth control regimen. After completing her birth control pack, she had a week of spotting followed by heavy bleeding starting again on July 13, 2023. The bleeding is severe, requiring frequent tampon changes, which is unusual for her. No associated pain with the current bleeding.  She has been on birth control for two months, currently on her second month with this pack. Prior to starting birth control, she did not typically experience heavy periods, although she had heavier periods when she was younger. She has a history of ovarian cysts and experienced severe abdominal pain prior to the onset of the initial bleeding, similar to the pain of a rupturing cyst. No history of fibroids.  In terms of her mood, she is taking Lexapro, Wellbutrin, and Buspar, and feels better and well-managed on these medications. She noticed a difference after starting Lexapro, although it took some time to take effect.  During the review of symptoms, she frequently feels cold, which she attributes to potential anemia due to blood loss. No new changes or problems aside from the bleeding.      Social History   Tobacco Use  Smoking Status Never  Smokeless Tobacco Never    Current Outpatient Medications on File Prior to Visit  Medication Sig Dispense Refill   buPROPion (WELLBUTRIN XL) 300 MG 24 hr tablet Take 1 tablet (300 mg total) by mouth daily. 90 tablet 3    busPIRone (BUSPAR) 7.5 MG tablet TAKE 1 TABLET IN THE MORNING, AT NOON, IN THE EVENING AND AT BEDTIME 270 tablet 3   hydrOXYzine (ATARAX) 10 MG tablet Take 1 tablet (10 mg total) by mouth 3 (three) times daily as needed for anxiety. 30 tablet 0   methocarbamol (ROBAXIN) 750 MG tablet Take 1 tablet (750 mg total) by mouth every 6 (six) hours as needed for muscle spasms. 120 tablet 5   norgestimate-ethinyl estradiol (ORTHO-CYCLEN) 0.25-35 MG-MCG tablet Take 1 tablet by mouth daily.     No current facility-administered medications on file prior to visit.    ROS see history of present illness  Objective  Physical Exam Vitals:   07/16/23 1308  BP: 100/68  Pulse: 77  Temp: 98.5 F (36.9 C)  SpO2: 99%    BP Readings from Last 3 Encounters:  07/16/23 100/68  06/04/23 110/70  04/29/23 110/78   Wt Readings from Last 3 Encounters:  07/16/23 127 lb (57.6 kg)  06/04/23 131 lb 3.2 oz (59.5 kg)  04/29/23 131 lb 3.2 oz (59.5 kg)    Physical Exam Constitutional:      General: She is not in acute distress.    Appearance: Normal appearance.  HENT:     Head: Normocephalic.  Cardiovascular:     Rate and Rhythm: Normal rate and regular rhythm.     Heart sounds: Normal heart sounds.  Pulmonary:     Effort: Pulmonary effort is normal.     Breath sounds: Normal breath sounds.  Abdominal:  General: Abdomen is flat. Bowel sounds are normal.     Palpations: Abdomen is soft.     Tenderness: There is no abdominal tenderness.  Skin:    General: Skin is warm and dry.  Neurological:     General: No focal deficit present.     Mental Status: She is alert.  Psychiatric:        Mood and Affect: Mood normal.        Behavior: Behavior normal.     Assessment/Plan: Please see individual problem list.  Menometrorrhagia Assessment & Plan: Prolonged heavy menstrual bleeding with possible anemia suggested by feeling cold. Discussed could be related to adjusting to birth control or other issue  such as fibroids. Order a transvaginal ultrasound to evaluate for fibroids or ovarian cysts and check blood counts for anemia. Advise continuation of birth control as scheduled. Instruct her to seek immediate care for severe fatigue, shortness of breath, or increase in bleeding. Consider an OB/GYN referral if necessary.   Orders: -     US PELVIC COMPLETE WITH TRANSVAGINAL; Future -     CBC with Differential/Platelet -     IBC + Ferritin  Anxiety and depression Assessment & Plan: Her mood is well-managed on Lexapro 10 mg daily, Wellbutrin XL 300 mg daily, and Buspar 7.5 mg three times daily, with a positive response to medication. PHQ- 5 and GAD- 4 today. Continue Lexapro, Wellbutrin, and Buspar. Refills sent. Encouraged to contact if worsening symptoms, unusual behavior changes or suicidal thoughts occur.   Orders: -     Escitalopram Oxalate; Take 1 tablet (10 mg total) by mouth daily.  Dispense: 90 tablet; Refill: 2   Return in about 8 months (around 03/04/2024), or if symptoms worsen or fail to improve, for Annual Exam.   Bethanie Dicker, NP-C [facility]

## 2023-07-16 NOTE — Assessment & Plan Note (Addendum)
 Prolonged heavy menstrual bleeding with possible anemia suggested by feeling cold. Discussed could be related to adjusting to birth control or other issue such as fibroids. Order a transvaginal ultrasound to evaluate for fibroids or ovarian cysts and check blood counts for anemia. Advise continuation of birth control as scheduled. Instruct her to seek immediate care for severe fatigue, shortness of breath, or increase in bleeding. Consider an OB/GYN referral if necessary.

## 2023-07-17 ENCOUNTER — Ambulatory Visit
Admission: RE | Admit: 2023-07-17 | Discharge: 2023-07-17 | Disposition: A | Discharge status: Home / Self Care | Source: Ambulatory Visit | Attending: Nurse Practitioner | Admitting: Nurse Practitioner

## 2023-07-17 DIAGNOSIS — N921 Excessive and frequent menstruation with irregular cycle: Secondary | ICD-10-CM | POA: Diagnosis present

## 2023-07-18 ENCOUNTER — Other Ambulatory Visit: Payer: Self-pay | Admitting: Nurse Practitioner

## 2023-07-18 LAB — IBC + FERRITIN
Ferritin: 13.9 ng/mL (ref 10.0–291.0)
Iron: 70 ug/dL (ref 42–145)
Saturation Ratios: 14.3 % — ABNORMAL LOW (ref 20.0–50.0)
TIBC: 490 ug/dL — ABNORMAL HIGH (ref 250.0–450.0)
Transferrin: 350 mg/dL (ref 212.0–360.0)

## 2023-07-18 NOTE — Telephone Encounter (Signed)
 Copied from CRM [id number]. Topic: Clinical - Medication Refill >> Jul 18, 2023  2:12 PM Deaijah H wrote: Most Recent Primary Care Visit:  Provider: Bethanie Dicker  Department: [facility]  Visit Type: OFFICE VISIT  Date: 07/16/2023  Medication: norgestimate-ethinyl estradiol (ORTHO-CYCLEN) 0.25-35 MG-MCG tablet  Has the patient contacted their pharmacy? Yes (Agent: If no, request that the patient contact the pharmacy for the refill. If patient does not wish to contact the pharmacy document the reason why and proceed with request.) (Agent: If yes, when and what did the pharmacy advise?)  Is this the correct pharmacy for this prescription? Yes If no, delete pharmacy and type the correct one.  This is the patient's preferred pharmacy:  [facility] [place], [place] - [address] [place] [address] Phone: [phone] Fax: [phone]   Has the prescription been filled recently? No  Is the patient out of the medication? Yes  Has the patient been seen for an appointment in the last year OR does the patient have an upcoming appointment? Yes  Can we respond through MyChart? Yes  Agent: Please be advised that Rx refills may take up to 3 business days. We ask that you follow-up with your pharmacy.

## 2023-07-22 ENCOUNTER — Other Ambulatory Visit: Payer: Self-pay | Admitting: Nurse Practitioner

## 2023-07-22 DIAGNOSIS — N921 Excessive and frequent menstruation with irregular cycle: Secondary | ICD-10-CM

## 2023-07-22 MED ORDER — NORGESTIMATE-ETH ESTRADIOL 0.25-35 MG-MCG PO TABS: 1.0000 | ORAL_TABLET | Freq: Every day | ORAL | 3 refills | Status: AC

## 2023-09-01 DIAGNOSIS — H5213 Myopia, bilateral: Secondary | ICD-10-CM | POA: Diagnosis not present

## 2023-10-30 ENCOUNTER — Ambulatory Visit: Admitting: Nurse Practitioner

## 2023-10-30 ENCOUNTER — Encounter: Payer: Self-pay | Admitting: Nurse Practitioner

## 2023-10-30 VITALS — BP 100/62 | HR 86 | Temp 98.4°F | Ht 62.0 in | Wt 127.2 lb

## 2023-10-30 DIAGNOSIS — F418 Other specified anxiety disorders: Secondary | ICD-10-CM | POA: Diagnosis not present

## 2023-10-30 DIAGNOSIS — Z1283 Encounter for screening for malignant neoplasm of skin: Secondary | ICD-10-CM | POA: Diagnosis not present

## 2023-10-30 DIAGNOSIS — K219 Gastro-esophageal reflux disease without esophagitis: Secondary | ICD-10-CM | POA: Diagnosis not present

## 2023-10-30 DIAGNOSIS — R140 Abdominal distension (gaseous): Secondary | ICD-10-CM

## 2023-10-30 DIAGNOSIS — E611 Iron deficiency: Secondary | ICD-10-CM

## 2023-10-30 DIAGNOSIS — F419 Anxiety disorder, unspecified: Secondary | ICD-10-CM | POA: Diagnosis not present

## 2023-10-30 DIAGNOSIS — F32A Depression, unspecified: Secondary | ICD-10-CM | POA: Diagnosis not present

## 2023-10-30 LAB — CORTISOL: Cortisol, Plasma: 12.1 ug/dL

## 2023-10-30 LAB — VITAMIN D 25 HYDROXY (VIT D DEFICIENCY, FRACTURES): VITD: 67.46 ng/mL (ref 30.00–100.00)

## 2023-10-30 LAB — TESTOSTERONE: Testosterone: 0 ng/dL — ABNORMAL LOW (ref 15.00–40.00)

## 2023-10-30 LAB — IBC + FERRITIN
Ferritin: 34.4 ng/mL (ref 10.0–291.0)
Iron: 100 ug/dL (ref 42–145)
Saturation Ratios: 21.3 % (ref 20.0–50.0)
TIBC: 469 ug/dL — ABNORMAL HIGH (ref 250.0–450.0)
Transferrin: 335 mg/dL (ref 212.0–360.0)

## 2023-10-30 LAB — TSH: TSH: 1.5 u[IU]/mL (ref 0.35–5.50)

## 2023-10-30 LAB — VITAMIN B12: Vitamin B-12: 341 pg/mL (ref 211–911)

## 2023-10-30 LAB — ESTRADIOL: Estradiol: <15 pg/mL

## 2023-10-30 MED ORDER — BUSPIRONE HCL 10 MG PO TABS: 10.0000 mg | ORAL_TABLET | Freq: Four times a day (QID) | ORAL | 1 refills | Status: DC

## 2023-10-30 MED ORDER — OMEPRAZOLE 20 MG PO CPDR: 20.0000 mg | DELAYED_RELEASE_CAPSULE | Freq: Every day | ORAL | 1 refills | Status: DC

## 2023-10-30 MED ORDER — HYDROXYZINE PAMOATE 25 MG PO CAPS
25.0000 mg | ORAL_CAPSULE | Freq: Three times a day (TID) | ORAL | 2 refills | Status: DC | PRN
Start: 2023-10-30 — End: 2023-11-04

## 2023-10-30 NOTE — Progress Notes (Signed)
 [name], NP-C Phone: [phone]  Betty Gentry is a 38 y.o. female who presents today for multiple concerns.   Discussed the use of AI scribe software for clinical note transcription with the patient, who gave verbal consent to proceed.  History of Present Illness   Betty Gentry is a 38 year old female who presents with concerns about medication side effects and gastrointestinal symptoms.  She has been experiencing weight gain while on Lexapro , which she attributes to the medication. Her parents have also experienced weight gain on Lexapro . She weaned off Lexapro  over a month and has been managing without it, but feels she needs another medication for anxiety and depression. She is currently taking Wellbutrin  and Buspar , and uses hydroxyzine  as needed, particularly in the mornings when her anxiety is heightened due to managing her children and daily tasks.  She reports a short temper and heightened anxiety in the mornings, which she describes as 'about to lose my cool' when her children are active. She feels her anxiety decreases as the day progresses. She is currently taking Buspar  four times a day and Wellbutrin . She is interested in checking lab work for any deficiencies or other underlying problems that could be contributing to her anxiety.  She has experienced episodes of vomiting, which she suspects may be related to stomach acid. She describes the sensation as food not going down properly and then coming back up, occurring sporadically. She has been taking over-the-counter famotidine 10 mg in the morning, but is unsure of its effectiveness. No heartburn but reports a sensation of food being 'stuck' and regurgitation. She has not tried other medications for this issue.  She has a history of heavy menstrual periods, which have improved since starting birth control. No recent abnormal bleeding.  She is concerned about a raised spot on her leg and another on her stomach,  which have recently appeared. She has a history of freckles and is cautious about skin changes.      Social History   Tobacco Use  Smoking Status Never  Smokeless Tobacco Never    Current Outpatient Medications on File Prior to Visit  Medication Sig Dispense Refill   buPROPion  (WELLBUTRIN  XL) 300 MG 24 hr tablet Take 1 tablet (300 mg total) by mouth daily. 90 tablet 3   methocarbamol  (ROBAXIN ) 750 MG tablet Take 1 tablet (750 mg total) by mouth every 6 (six) hours as needed for muscle spasms. 120 tablet 5   norgestimate -ethinyl estradiol  (ORTHO-CYCLEN) 0.25-35 MG-MCG tablet Take 1 tablet by mouth daily. 84 tablet 3   No current facility-administered medications on file prior to visit.     ROS see history of present illness  Objective  Physical Exam Vitals:   10/30/23 0808  BP: 100/62  Pulse: 86  Temp: 98.4 F (36.9 C)  SpO2: 99%    BP Readings from Last 3 Encounters:  10/30/23 100/62  07/16/23 100/68  06/04/23 110/70   Wt Readings from Last 3 Encounters:  10/30/23 127 lb 3.2 oz (57.7 kg)  07/16/23 127 lb (57.6 kg)  06/04/23 131 lb 3.2 oz (59.5 kg)    Physical Exam Constitutional:      General: She is not in acute distress.    Appearance: Normal appearance.  HENT:     Head: Normocephalic.  Cardiovascular:     Rate and Rhythm: Normal rate and regular rhythm.     Heart sounds: Normal heart sounds.  Pulmonary:     Effort: Pulmonary effort is normal.  Breath sounds: Normal breath sounds.  Skin:    General: Skin is warm and dry.  Neurological:     General: No focal deficit present.     Mental Status: She is alert.  Psychiatric:        Mood and Affect: Mood normal.        Behavior: Behavior normal.      Assessment/Plan: Please see individual problem list.  Anxiety and depression Assessment & Plan: She experiences increased anxiety, particularly in the mornings, possibly due to Wellbutrin  and Buspar . Preferring medication adjustments over a new SSRI  due to weight concerns, increase Buspar  to 10 mg four times a day. Continue Wellbutrin . Refill hydroxyzine  and increase to 25 mg as needed, especially in the morning. Monitor her response to these adjustments and reassess in six weeks.  Orders: -     Vitamin B12 -     VITAMIN D  25 Hydroxy (Vit-D Deficiency, Fractures) -     TSH -     Estradiol  -     Testosterone  -     busPIRone  HCl; Take 1 tablet (10 mg total) by mouth in the morning, at noon, in the evening, and at bedtime.  Dispense: 120 tablet; Refill: 1  Iron deficiency -     IBC + Ferritin  Bloating -     Cortisol  Gastroesophageal reflux disease, unspecified whether esophagitis present Assessment & Plan: She experiences postprandial nausea and vomiting, likely due to esophageal spasms, with limited relief from famotidine. Start omeprazole  in the morning and continue famotidine at bedtime if needed. Monitor symptoms and adjust treatment as necessary. Consider referral to GI for endoscopy if persisting or worsening. Encourage dietary modifications.   Orders: -     Omeprazole ; Take 1 capsule (20 mg total) by mouth daily.  Dispense: 90 capsule; Refill: 1  Skin cancer screening -     Ambulatory referral to Dermatology     Return in about 6 weeks (around 12/11/2023) for Anxiety/Depression.   [name], NP-C [facility]

## 2023-10-31 ENCOUNTER — Ambulatory Visit: Payer: Self-pay | Admitting: Nurse Practitioner

## 2023-11-01 ENCOUNTER — Encounter: Payer: Self-pay | Admitting: Nurse Practitioner

## 2023-11-04 ENCOUNTER — Other Ambulatory Visit: Payer: Self-pay | Admitting: Nurse Practitioner

## 2023-11-04 DIAGNOSIS — F419 Anxiety disorder, unspecified: Secondary | ICD-10-CM

## 2023-11-04 MED ORDER — HYDROXYZINE HCL 10 MG PO TABS: 10.0000 mg | ORAL_TABLET | Freq: Three times a day (TID) | ORAL | 5 refills | Status: AC | PRN

## 2023-11-06 ENCOUNTER — Encounter: Payer: Self-pay | Admitting: Nurse Practitioner

## 2023-11-06 ENCOUNTER — Telehealth: Payer: Self-pay | Admitting: Nurse Practitioner

## 2023-11-06 NOTE — Telephone Encounter (Signed)
 Copied from CRM [id number]. Topic: Referral - Status >> Nov 06, 2023 12:04 PM Martinique E wrote: Reason for CRM: Patient stated she had a visit with PCP on 7/10 and discussed placing a referral for dermatology and endocrinology, patient would like an update on these referrals. Callback number [phone].

## 2023-11-06 NOTE — Assessment & Plan Note (Signed)
 She experiences increased anxiety, particularly in the mornings, possibly due to Wellbutrin  and Buspar . Preferring medication adjustments over a new SSRI due to weight concerns, increase Buspar  to 10 mg four times a day. Continue Wellbutrin . Refill hydroxyzine  and increase to 25 mg as needed, especially in the morning. Monitor her response to these adjustments and reassess in six weeks.

## 2023-11-06 NOTE — Assessment & Plan Note (Signed)
 She experiences postprandial nausea and vomiting, likely due to esophageal spasms, with limited relief from famotidine. Start omeprazole  in the morning and continue famotidine at bedtime if needed. Monitor symptoms and adjust treatment as necessary. Consider referral to GI for endoscopy if persisting or worsening. Encourage dietary modifications.

## 2023-11-26 ENCOUNTER — Ambulatory Visit: Admitting: Nurse Practitioner

## 2023-12-02 ENCOUNTER — Ambulatory Visit: Admitting: Obstetrics and Gynecology

## 2023-12-03 ENCOUNTER — Telehealth: Admitting: Physician Assistant

## 2023-12-03 DIAGNOSIS — B3731 Acute candidiasis of vulva and vagina: Secondary | ICD-10-CM

## 2023-12-03 MED ORDER — FLUCONAZOLE 150 MG PO TABS: ORAL_TABLET | ORAL | 0 refills | Status: DC

## 2023-12-03 NOTE — Progress Notes (Signed)
 Virtual Visit Consent   Betty Gentry, you are scheduled for a virtual visit with a [facility] provider today. Just as with appointments in the office, your consent must be obtained to participate. Your consent will be active for this visit and any virtual visit you may have with one of our providers in the next 365 days. If you have a MyChart account, a copy of this consent can be sent to you electronically.  As this is a virtual visit, video technology does not allow for your provider to perform a traditional examination. This may limit your provider's ability to fully assess your condition. If your provider identifies any concerns that need to be evaluated in person or the need to arrange testing (such as labs, EKG, etc.), we will make arrangements to do so. Although advances in technology are sophisticated, we cannot ensure that it will always work on either your end or our end. If the connection with a video visit is poor, the visit may have to be switched to a telephone visit. With either a video or telephone visit, we are not always able to ensure that we have a secure connection.  By engaging in this virtual visit, you consent to the provision of healthcare and authorize for your insurance to be billed (if applicable) for the services provided during this visit. Depending on your insurance coverage, you may receive a charge related to this service.  I need to obtain your verbal consent now. Are you willing to proceed with your visit today? [name] has provided verbal consent on 12/03/2023 for a virtual visit (video or telephone). Betty Gentry, [place]  Date: 12/03/2023 8:04 AM   Virtual Visit via Video Note   I, Betty Gentry, connected with  DWANA GARIN  ([id number], March 13, 1986) on 12/03/23 at  8:00 AM EDT by a video-enabled telemedicine application and verified that I am speaking with the correct person using two identifiers.  Location: Patient: Virtual  Visit Location Patient: Home Provider: Virtual Visit Location Provider: Home Office   I discussed the limitations of evaluation and management by telemedicine and the availability of in person appointments. The patient expressed understanding and agreed to proceed.    History of Present Illness: Betty Gentry is a 38 y.o. who identifies as a female who was assigned female at birth, and is being seen today for possible yeast infection. Endorses symptoms starting yesterday with burning and itching with mild discharge. Denies change to soaps, lotions, feminine hygiene products. Is currently at the beach. Denies concerns for pregnancy.   HPI: HPI  Problems:  Patient Active Problem List   Diagnosis Date Noted   Iron deficiency 10/30/2023   Menometrorrhagia 07/16/2023   Hypertriglyceridemia without hypercholesterolemia 06/04/2023   Poor short term memory 06/04/2023   Grief associated with loss of fetus 03/04/2023   Preventative health care 03/04/2023   Chronic back pain 03/04/2023   previous pregnancy with encephalocele 01/10/2023   Gastroesophageal reflux disease 10/17/2017   Anxiety and depression 08/16/2009    Allergies:  Allergies  Allergen Reactions   Erythromycin Nausea Only and Nausea And Vomiting   Sulfa Antibiotics Nausea And Vomiting, Nausea Only and Other (See Comments)    Other Reaction(s): Not available  Other Reaction(s): Unknown   Tetracyclines & Related Nausea Only, Nausea And Vomiting and Other (See Comments)    Other Reaction(s): Not available   Misc. Sulfonamide Containing Compounds     Other Reaction(s): Not available  Substance with sulfonamide structure  and antibacterial mechanism of action (substance)   Nsaids    Medications:  Current Outpatient Medications:    fluconazole  (DIFLUCAN ) 150 MG tablet, Take 1 tablet PO once. Repeat in 3 days if needed., Disp: 2 tablet, Rfl: 0   buPROPion  (WELLBUTRIN  XL) 300 MG 24 hr tablet, Take 1 tablet (300 mg total) by  mouth daily., Disp: 90 tablet, Rfl: 3   busPIRone  (BUSPAR ) 10 MG tablet, Take 1 tablet (10 mg total) by mouth in the morning, at noon, in the evening, and at bedtime., Disp: 120 tablet, Rfl: 1   hydrOXYzine  (ATARAX ) 10 MG tablet, Take 1 tablet (10 mg total) by mouth 3 (three) times daily as needed., Disp: 30 tablet, Rfl: 5   methocarbamol  (ROBAXIN ) 750 MG tablet, Take 1 tablet (750 mg total) by mouth every 6 (six) hours as needed for muscle spasms., Disp: 120 tablet, Rfl: 5   norgestimate -ethinyl estradiol  (ORTHO-CYCLEN) 0.25-35 MG-MCG tablet, Take 1 tablet by mouth daily., Disp: 84 tablet, Rfl: 3   omeprazole  (PRILOSEC) 20 MG capsule, Take 1 capsule (20 mg total) by mouth daily., Disp: 90 capsule, Rfl: 1  Observations/Objective: Patient is well-developed, well-nourished in no acute distress.  Resting comfortably at home.  Head is normocephalic, atraumatic.  No labored breathing. Speech is clear and coherent with logical content.  Patient is alert and oriented at baseline.   Assessment and Plan: 1. Yeast vaginitis (Primary) - fluconazole  (DIFLUCAN ) 150 MG tablet; Take 1 tablet PO once. Repeat in 3 days if needed.  Dispense: 2 tablet; Refill: 0  Supportive measures and OTC medications reviewed. Diflucan  per orders. Follow-up in person for any non-resolving, new or worsening symptoms.   Follow Up Instructions: I discussed the assessment and treatment plan with the patient. The patient was provided an opportunity to ask questions and all were answered. The patient agreed with the plan and demonstrated an understanding of the instructions.  A copy of instructions were sent to the patient via MyChart unless otherwise noted below.   The patient was advised to call back or seek an in-person evaluation if the symptoms worsen or if the condition fails to improve as anticipated.    Betty Velma Lunger, PA-C

## 2023-12-03 NOTE — Patient Instructions (Signed)
 Betty Gentry, thank you for joining Betty Velma Lunger, PA-C for today's virtual visit.  While this provider is not your primary care provider (PCP), if your PCP is located in our provider database this encounter information will be shared with them immediately following your visit.   A [facility] MyChart account gives you access to today's visit and all your visits, tests, and labs performed at [facility]  click here if you don't have a [facility] MyChart account or go to [email or web address]  Consent: (Patient) Betty Gentry provided verbal consent for this virtual visit at the beginning of the encounter.  Current Medications:  Current Outpatient Medications:    fluconazole  (DIFLUCAN ) 150 MG tablet, Take 1 tablet PO once. Repeat in 3 days if needed., Disp: 2 tablet, Rfl: 0   buPROPion  (WELLBUTRIN  XL) 300 MG 24 hr tablet, Take 1 tablet (300 mg total) by mouth daily., Disp: 90 tablet, Rfl: 3   busPIRone  (BUSPAR ) 10 MG tablet, Take 1 tablet (10 mg total) by mouth in the morning, at noon, in the evening, and at bedtime., Disp: 120 tablet, Rfl: 1   hydrOXYzine  (ATARAX ) 10 MG tablet, Take 1 tablet (10 mg total) by mouth 3 (three) times daily as needed., Disp: 30 tablet, Rfl: 5   methocarbamol  (ROBAXIN ) 750 MG tablet, Take 1 tablet (750 mg total) by mouth every 6 (six) hours as needed for muscle spasms., Disp: 120 tablet, Rfl: 5   norgestimate -ethinyl estradiol  (ORTHO-CYCLEN) 0.25-35 MG-MCG tablet, Take 1 tablet by mouth daily., Disp: 84 tablet, Rfl: 3   omeprazole  (PRILOSEC) 20 MG capsule, Take 1 capsule (20 mg total) by mouth daily., Disp: 90 capsule, Rfl: 1   Medications ordered in this encounter:  Meds ordered this encounter  Medications   fluconazole  (DIFLUCAN ) 150 MG tablet    Sig: Take 1 tablet PO once. Repeat in 3 days if needed.    Dispense:  2 tablet    Refill:  0    Supervising Provider:   LAMPTEY, PHILIP O [[id number]]     *If you need refills on  other medications prior to your next appointment, please contact your pharmacy*  Follow-Up: Call back or seek an in-person evaluation if the symptoms worsen or if the condition fails to improve as anticipated.  [facility] [phone]  Other Instructions Vaginal Yeast Infection, Adult  Vaginal yeast infection is a condition that causes vaginal discharge as well as soreness, swelling, and redness (inflammation) of the vagina. This is a common condition. Some women get this infection frequently. What are the causes? This condition is caused by a change in the normal balance of the yeast (Candida) and normal bacteria that live in the vagina. This change causes an overgrowth of yeast, which causes the inflammation. What increases the risk? The condition is more likely to develop in women who: Take antibiotic medicines. Have diabetes. Take birth control pills. Are pregnant. Douche often. Have a weak body defense system (immune system). Have been taking steroid medicines for a long time. Frequently wear tight clothing. What are the signs or symptoms? Symptoms of this condition include: White, thick, creamy vaginal discharge. Swelling, itching, redness, and irritation of the vagina. The lips of the vagina (labia) may be affected as well. Pain or a burning feeling while urinating. Pain during sex. How is this diagnosed? This condition is diagnosed based on: Your medical history. A physical exam. A pelvic exam. Your health care provider will examine a sample of your vaginal discharge under  a microscope. Your health care provider may send this sample for testing to confirm the diagnosis. How is this treated? This condition is treated with medicine. Medicines may be over-the-counter or prescription. You may be told to use one or more of the following: Medicine that is taken by mouth (orally). Medicine that is applied as a cream (topically). Medicine that is inserted  directly into the vagina (suppository). Follow these instructions at home: Take or apply over-the-counter and prescription medicines only as told by your health care provider. Do not use tampons until your health care provider approves. Do not have sex until your infection has cleared. Sex can prolong or worsen your symptoms of infection. Ask your health care provider when it is safe to resume sexual activity. Keep all follow-up visits. This is important. How is this prevented?  Do not wear tight clothes, such as pantyhose or tight pants. Wear breathable cotton underwear. Do not use douches, perfumed soap, creams, or powders. Wipe from front to back after using the toilet. If you have diabetes, keep your blood sugar levels under control. Ask your health care provider for other ways to prevent yeast infections. Contact a health care provider if: You have a fever. Your symptoms go away and then return. Your symptoms do not get better with treatment. Your symptoms get worse. You have new symptoms. You develop blisters in or around your vagina. You have blood coming from your vagina and it is not your menstrual period. You develop pain in your abdomen. Summary Vaginal yeast infection is a condition that causes discharge as well as soreness, swelling, and redness (inflammation) of the vagina. This condition is treated with medicine. Medicines may be over-the-counter or prescription. Take or apply over-the-counter and prescription medicines only as told by your health care provider. Do not douche. Resume sexual activity or use of tampons as instructed by your health care provider. Contact a health care provider if your symptoms do not get better with treatment or your symptoms go away and then return. This information is not intended to replace advice given to you by your health care provider. Make sure you discuss any questions you have with your health care provider. Document Revised:  06/26/2020 Document Reviewed: 06/26/2020 Elsevier Patient Education  2024 Elsevier Inc.   If you have been instructed to have an in-person evaluation today at a local Urgent Care facility, please use the link below. It will take you to a list of all of our available [facility], including address, phone number and hours of operation. Please do not delay care.  [facility]  If you or a family member do not have a primary care provider, use the link below to schedule a visit and establish care. When you choose a [facility] primary care physician or advanced practice provider, you gain a long-term partner in health. Find a Primary Care Provider  Learn more about [facility]'s in-office and virtual care options: [facility] - Get Care Now

## 2023-12-18 ENCOUNTER — Ambulatory Visit: Payer: Self-pay | Admitting: Nurse Practitioner

## 2023-12-18 ENCOUNTER — Ambulatory Visit: Admitting: Nurse Practitioner

## 2023-12-18 VITALS — BP 112/72 | HR 78 | Temp 98.3°F | Ht 62.0 in | Wt 121.0 lb

## 2023-12-18 DIAGNOSIS — R7989 Other specified abnormal findings of blood chemistry: Secondary | ICD-10-CM | POA: Diagnosis not present

## 2023-12-18 DIAGNOSIS — F32A Depression, unspecified: Secondary | ICD-10-CM

## 2023-12-18 DIAGNOSIS — F419 Anxiety disorder, unspecified: Secondary | ICD-10-CM

## 2023-12-18 DIAGNOSIS — G479 Sleep disorder, unspecified: Secondary | ICD-10-CM | POA: Diagnosis not present

## 2023-12-18 LAB — TESTOSTERONE: Testosterone: 0 ng/dL — ABNORMAL LOW (ref 15.00–40.00)

## 2023-12-18 MED ORDER — SERTRALINE HCL 50 MG PO TABS: 50.0000 mg | ORAL_TABLET | Freq: Every day | ORAL | 0 refills | Status: AC

## 2023-12-18 NOTE — Progress Notes (Signed)
 [name], NP-C Phone: [phone]  Betty Gentry is a 38 y.o. female who presents today for follow up.   Discussed the use of AI scribe software for clinical note transcription with the patient, who gave verbal consent to proceed.  History of Present Illness   Betty Gentry is a 38 year old female who presents with mood disturbances and anxiety.  She experiences significant mood disturbances, characterized by episodes of intense irritability and rage, particularly when provoked by minor irritations. These episodes feel like a 'switch' that flips, leading to a loss of patience, especially in interactions with her daughter, who is undergoing testing for a form of autism and has become increasingly sensitive to textures and feelings.  She has been on Wellbutrin  300 mg daily, which she feels may be helping slightly, but not sufficiently. She also takes Buspar  10 mg four times a day and hydroxyzine  tablets as needed, which help with anxiety but not with mood stabilization. Previous trials of Lexapro  and Zoloft  were not effective, with Zoloft  being used prior to her second pregnancy and discontinued without noticeable withdrawal symptoms. She also tried hydroxyzine  pamoate, which caused hot flashes and disrupted sleep, leading her to discontinue it after one day.  She reports sleep disturbances, specifically waking up every night between 2:00 and 3:00 AM and having difficulty returning to sleep. She reports that stress is a significant factor in her life.  She mentions a concern about her hormone levels, particularly testosterone , which was previously tested and found to be very low. She has not yet discussed these results with her OB/GYN, as her appointment is scheduled for October. She also notes a decrease in libido compared to previous years, which she attributes to life changes such as having children.  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      Social History   Tobacco Use  Smoking Status Never  Smokeless Tobacco Never    Current Outpatient Medications on File Prior to Visit  Medication Sig Dispense Refill   buPROPion  (WELLBUTRIN  XL) 300 MG 24 hr tablet Take 1 tablet (300 mg total) by mouth daily. 90 tablet 3   busPIRone  (BUSPAR ) 10 MG tablet Take 1 tablet (10 mg total) by mouth in the morning, at noon, in the evening, and at bedtime. 120 tablet 1   hydrOXYzine  (ATARAX ) 10 MG tablet Take 1 tablet (10 mg total) by mouth 3 (three) times daily as needed. 30 tablet 5   methocarbamol  (ROBAXIN ) 750 MG tablet Take 1 tablet (750 mg total) by mouth every 6 (six) hours as needed for muscle spasms. 120 tablet 5   norgestimate -ethinyl estradiol  (ORTHO-CYCLEN) 0.25-35 MG-MCG tablet Take 1 tablet by mouth daily. 84 tablet 3   omeprazole  (PRILOSEC) 20 MG capsule Take 1 capsule (20 mg total) by mouth daily. 90 capsule 1   No current facility-administered medications on file prior to visit.     ROS see history of present illness  Objective  Physical Exam Vitals:   12/18/23 0831  BP: 112/72  Pulse: 78  Temp: 98.3 F (36.8 C)  SpO2: 99%    BP Readings from Last 3 Encounters:  12/18/23 112/72  10/30/23 100/62  07/16/23 100/68   Wt Readings from Last 3 Encounters:  12/18/23 121 lb (54.9 kg)  10/30/23 127 lb 3.2 oz (57.7 kg)  07/16/23 127 lb (57.6 kg)    Physical Exam Constitutional:      General: She is not  in acute distress.    Appearance: Normal appearance.  HENT:     Head: Normocephalic.  Cardiovascular:     Rate and Rhythm: Normal rate and regular rhythm.     Heart sounds: Normal heart sounds.  Pulmonary:     Effort: Pulmonary effort is normal.     Breath sounds: Normal breath sounds.  Skin:    General: Skin is warm and dry.  Neurological:     General: No focal deficit present.     Mental Status: She is alert.  Psychiatric:        Mood and Affect: Mood normal.         Behavior: Behavior normal.      Assessment/Plan: Please see individual problem list.  Anxiety and depression Assessment & Plan: Significant irritability and mood swings persist. Current medications include Wellbutrin , Buspar , and Atarax . Buspar  and Atarax  are ineffective for mood stabilization, while Wellbutrin  may provide slight benefit. An SSRI in conjunction with Wellbutrin  may aid in mood stabilization. Prescribe Zoloft  50 mg daily advising to take it at bedtime to minimize drowsiness. Instruct to take Zoloft  daily and allow 4-6 weeks to assess effectiveness. Encourage stress management strategies. Counseled patient on common side effects. Encouraged to contact if worsening symptoms, unusual behavior changes or suicidal thoughts occur.   Orders: -     Sertraline  HCl; Take 1 tablet (50 mg total) by mouth at bedtime.  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 Has not yet discussed with OB/GYN; appointment scheduled for October. Rechecking testosterone  levels is suggested. Order repeat testosterone  level test. Consider discussing hormone replacement options with OB/GYN in October.   Orders: -     Testosterone   Sleep disturbance Assessment & Plan: She experiences nocturnal awakenings between 2:00 and 3:00 AM with difficulty returning to sleep, potentially related to stress and cortisol levels. Address stress management to improve sleep patterns. Counseled on sleep hygiene.       Return in about 6 weeks (around 01/29/2024) for Anxiety/Depression.   [name], NP-C [facility]

## 2023-12-19 DIAGNOSIS — Z1329 Encounter for screening for other suspected endocrine disorder: Secondary | ICD-10-CM | POA: Diagnosis not present

## 2023-12-19 DIAGNOSIS — N951 Menopausal and female climacteric states: Secondary | ICD-10-CM | POA: Diagnosis not present

## 2023-12-30 DIAGNOSIS — G479 Sleep disorder, unspecified: Secondary | ICD-10-CM | POA: Diagnosis not present

## 2023-12-30 DIAGNOSIS — N951 Menopausal and female climacteric states: Secondary | ICD-10-CM | POA: Diagnosis not present

## 2023-12-30 DIAGNOSIS — N3281 Overactive bladder: Secondary | ICD-10-CM | POA: Diagnosis not present

## 2023-12-30 DIAGNOSIS — E7800 Pure hypercholesterolemia, unspecified: Secondary | ICD-10-CM | POA: Diagnosis not present

## 2023-12-30 DIAGNOSIS — Z79890 Hormone replacement therapy (postmenopausal): Secondary | ICD-10-CM | POA: Diagnosis not present

## 2023-12-30 DIAGNOSIS — R454 Irritability and anger: Secondary | ICD-10-CM | POA: Diagnosis not present

## 2023-12-30 DIAGNOSIS — M549 Dorsalgia, unspecified: Secondary | ICD-10-CM | POA: Diagnosis not present

## 2023-12-30 DIAGNOSIS — F3340 Major depressive disorder, recurrent, in remission, unspecified: Secondary | ICD-10-CM | POA: Diagnosis not present

## 2023-12-30 DIAGNOSIS — R6882 Decreased libido: Secondary | ICD-10-CM | POA: Diagnosis not present

## 2023-12-30 DIAGNOSIS — F411 Generalized anxiety disorder: Secondary | ICD-10-CM | POA: Diagnosis not present

## 2023-12-30 DIAGNOSIS — M2550 Pain in unspecified joint: Secondary | ICD-10-CM | POA: Diagnosis not present

## 2023-12-31 ENCOUNTER — Encounter: Payer: Self-pay | Admitting: Nurse Practitioner

## 2023-12-31 DIAGNOSIS — R7989 Other specified abnormal findings of blood chemistry: Secondary | ICD-10-CM | POA: Insufficient documentation

## 2023-12-31 DIAGNOSIS — G479 Sleep disorder, unspecified: Secondary | ICD-10-CM | POA: Insufficient documentation

## 2023-12-31 NOTE — Assessment & Plan Note (Signed)
 Reports decreased libido compared to previous years. Has not yet discussed with OB/GYN; appointment scheduled for October. Rechecking testosterone  levels is suggested. Order repeat testosterone  level test. Consider discussing hormone replacement options with OB/GYN in October.

## 2023-12-31 NOTE — Assessment & Plan Note (Signed)
 Significant irritability and mood swings persist. Current medications include Wellbutrin , Buspar , and Atarax . Buspar  and Atarax  are ineffective for mood stabilization, while Wellbutrin  may provide slight benefit. An SSRI in conjunction with Wellbutrin  may aid in mood stabilization. Prescribe Zoloft  50 mg daily advising to take it at bedtime to minimize drowsiness. Instruct to take Zoloft  daily and allow 4-6 weeks to assess effectiveness. Encourage stress management strategies. Counseled patient on common side effects. Encouraged to contact if worsening symptoms, unusual behavior changes or suicidal thoughts occur.

## 2023-12-31 NOTE — Assessment & Plan Note (Addendum)
 She experiences nocturnal awakenings between 2:00 and 3:00 AM with difficulty returning to sleep, potentially related to stress and cortisol levels. Address stress management to improve sleep patterns. Counseled on sleep hygiene.

## 2024-01-29 DIAGNOSIS — N951 Menopausal and female climacteric states: Secondary | ICD-10-CM | POA: Diagnosis not present

## 2024-02-02 DIAGNOSIS — F411 Generalized anxiety disorder: Secondary | ICD-10-CM | POA: Diagnosis not present

## 2024-02-02 DIAGNOSIS — G479 Sleep disorder, unspecified: Secondary | ICD-10-CM | POA: Diagnosis not present

## 2024-02-02 DIAGNOSIS — Z6823 Body mass index (BMI) 23.0-23.9, adult: Secondary | ICD-10-CM | POA: Diagnosis not present

## 2024-02-02 DIAGNOSIS — Z79890 Hormone replacement therapy (postmenopausal): Secondary | ICD-10-CM | POA: Diagnosis not present

## 2024-02-02 DIAGNOSIS — N951 Menopausal and female climacteric states: Secondary | ICD-10-CM | POA: Diagnosis not present

## 2024-02-03 ENCOUNTER — Ambulatory Visit: Admitting: Nurse Practitioner

## 2024-02-25 ENCOUNTER — Other Ambulatory Visit: Payer: Self-pay | Admitting: Nurse Practitioner

## 2024-02-25 DIAGNOSIS — F32A Depression, unspecified: Secondary | ICD-10-CM

## 2024-03-05 ENCOUNTER — Encounter: Payer: Self-pay | Admitting: Nurse Practitioner

## 2024-03-05 ENCOUNTER — Ambulatory Visit (INDEPENDENT_AMBULATORY_CARE_PROVIDER_SITE_OTHER): Admitting: Nurse Practitioner

## 2024-03-05 VITALS — BP 118/82 | HR 90 | Temp 98.3°F | Ht 62.0 in | Wt 121.8 lb

## 2024-03-05 DIAGNOSIS — J069 Acute upper respiratory infection, unspecified: Secondary | ICD-10-CM | POA: Insufficient documentation

## 2024-03-05 MED ORDER — PSEUDOEPH-BROMPHEN-DM 30-2-10 MG/5ML PO SYRP: 5.0000 mL | ORAL_SOLUTION | Freq: Four times a day (QID) | ORAL | 0 refills | Status: AC | PRN

## 2024-03-05 MED ORDER — PREDNISONE 20 MG PO TABS: 40.0000 mg | ORAL_TABLET | Freq: Every day | ORAL | 0 refills | Status: AC

## 2024-03-05 NOTE — Assessment & Plan Note (Signed)
 She presents with an acute upper respiratory infection characterized by a dry cough, nasal congestion, and headache. Likely viral in nature at this time, do not feel antibiotics are warranted. Start Prednisone  to be taken in the morning. Bromfed cough medicine is prescribed at 5 mL during the day and 10 mL at bedtime if needed. She is instructed to message via MyChart if symptoms do not improve over the weekend, consider antibiotics at that time. Encourage adequate hydration.

## 2024-03-05 NOTE — Progress Notes (Signed)
 [name], NP-C Phone: [phone]  Betty Gentry is a 38 y.o. female who presents today for cough.   Discussed the use of AI scribe software for clinical note transcription with the patient, who gave verbal consent to proceed.  History of Present Illness   Betty Gentry is a 38 year old female who presents with a persistent cough and nasal congestion.  She has been experiencing a persistent dry cough for two days, severe enough to cause back pain and disrupt her sleep, allowing only about two hours of rest last night. The cough is accompanied by throat irritation.  Significant nasal congestion is present, particularly at night, attributed to drainage. No sore throat, shortness of breath, fever, fatigue, body aches, or nausea. However, she reports headaches and frequent ear popping.  She has attempted to manage her symptoms with Mucinex and leftover Hycodan from a previous COVID-19 infection earlier in the year, which provided some relief but did not completely alleviate her symptoms. The Hycodan helped her sleep better.  She recalls a similar episode approximately three weeks ago, characterized by green mucus, which eventually resolved without significant intervention. She feels worse now compared to the previous episode.      Social History   Tobacco Use  Smoking Status Never  Smokeless Tobacco Never    Current Outpatient Medications on File Prior to Visit  Medication Sig Dispense Refill   buPROPion  (WELLBUTRIN  XL) 300 MG 24 hr tablet Take 1 tablet (300 mg total) by mouth daily. 90 tablet 3   busPIRone  (BUSPAR ) 10 MG tablet TAKE ONE TABLET (10 MG TOTAL) BY MOUTH IN THE MORNING, AT NOON, IN THE EVENING, AND AT BEDTIME. 120 tablet 1   hydrOXYzine  (ATARAX ) 10 MG tablet Take 1 tablet (10 mg total) by mouth 3 (three) times daily as needed. 30 tablet 5   methocarbamol  (ROBAXIN ) 750 MG tablet Take 1 tablet (750 mg total) by mouth every 6 (six) hours as needed for muscle  spasms. 120 tablet 5   norgestimate -ethinyl estradiol  (ORTHO-CYCLEN) 0.25-35 MG-MCG tablet Take 1 tablet by mouth daily. 84 tablet 3   omeprazole  (PRILOSEC) 20 MG capsule Take 1 capsule (20 mg total) by mouth daily. 90 capsule 1   sertraline  (ZOLOFT ) 50 MG tablet Take 1 tablet (50 mg total) by mouth at bedtime. 90 tablet 0   No current facility-administered medications on file prior to visit.     ROS see history of present illness  Objective  Physical Exam Vitals:   03/05/24 1138 03/05/24 1144  BP: 118/82   Pulse: 90   Temp: 98.3 F (36.8 C)   SpO2: 90% 99%    BP Readings from Last 3 Encounters:  03/05/24 118/82  12/18/23 112/72  10/30/23 100/62   Wt Readings from Last 3 Encounters:  03/05/24 121 lb 12.8 oz (55.2 kg)  12/18/23 121 lb (54.9 kg)  10/30/23 127 lb 3.2 oz (57.7 kg)    Physical Exam Constitutional:      General: She is not in acute distress.    Appearance: Normal appearance.  HENT:     Head: Normocephalic.     Right Ear: Tympanic membrane normal.     Left Ear: Tympanic membrane normal.     Nose: Nose normal.     Mouth/Throat:     Mouth: Mucous membranes are moist.     Pharynx: Oropharynx is clear.  Eyes:     Conjunctiva/sclera: Conjunctivae normal.     Pupils: Pupils are equal, round, and reactive to light.  Cardiovascular:     Rate and Rhythm: Normal rate and regular rhythm.     Heart sounds: Normal heart sounds.  Pulmonary:     Effort: Pulmonary effort is normal.     Breath sounds: Normal breath sounds.  Lymphadenopathy:     Cervical: No cervical adenopathy.  Skin:    General: Skin is warm and dry.  Neurological:     General: No focal deficit present.     Mental Status: She is alert.  Psychiatric:        Mood and Affect: Mood normal.        Behavior: Behavior normal.      Assessment/Plan: Please see individual problem list.  URI with cough and congestion Assessment & Plan: She presents with an acute upper respiratory infection  characterized by a dry cough, nasal congestion, and headache. Likely viral in nature at this time, do not feel antibiotics are warranted. Start Prednisone  to be taken in the morning. Bromfed cough medicine is prescribed at 5 mL during the day and 10 mL at bedtime if needed. She is instructed to message via MyChart if symptoms do not improve over the weekend, consider antibiotics at that time. Encourage adequate hydration.   Orders: -     predniSONE ; Take 2 tablets (40 mg total) by mouth daily with breakfast.  Dispense: 10 tablet; Refill: 0 -     Pseudoeph-Bromphen-DM; Take 5-10 mLs by mouth every 6 (six) hours as needed.  Dispense: 120 mL; Refill: 0      Return if symptoms worsen or fail to improve.   [name], NP-C [facility]

## 2024-03-10 ENCOUNTER — Ambulatory Visit

## 2024-03-10 DIAGNOSIS — D1801 Hemangioma of skin and subcutaneous tissue: Secondary | ICD-10-CM

## 2024-03-10 DIAGNOSIS — W908XXA Exposure to other nonionizing radiation, initial encounter: Secondary | ICD-10-CM

## 2024-03-10 DIAGNOSIS — L821 Other seborrheic keratosis: Secondary | ICD-10-CM | POA: Diagnosis not present

## 2024-03-10 DIAGNOSIS — Z1283 Encounter for screening for malignant neoplasm of skin: Secondary | ICD-10-CM | POA: Diagnosis not present

## 2024-03-10 DIAGNOSIS — L814 Other melanin hyperpigmentation: Secondary | ICD-10-CM | POA: Diagnosis not present

## 2024-03-10 DIAGNOSIS — L578 Other skin changes due to chronic exposure to nonionizing radiation: Secondary | ICD-10-CM | POA: Diagnosis not present

## 2024-03-10 DIAGNOSIS — D229 Melanocytic nevi, unspecified: Secondary | ICD-10-CM

## 2024-03-10 NOTE — Progress Notes (Signed)
    Subjective   Betty Gentry is a 38 y.o. female who presents for the following: Total body skin exam for skin cancer screening and mole check. The patient has spots, moles and lesions to be evaluated, some may be new or changing and the patient may have concern these could be cancer.. Patient is new patient  Today patient reports: LOC at right neck, right arm, left leg. No personal hx of skin cancer, family hx of skin cancer parents, maternal grandparents.   Review of Systems:    No other skin or systemic complaints except as noted in HPI or Assessment and Plan.  The following portions of the chart were reviewed this encounter and updated as appropriate: medications, allergies, medical history  Relevant Medical History:  Family history of skin cancer - Parents, maternal grandparents   Objective  (SKPE) Well appearing patient in no apparent distress; mood and affect are within normal limits. Examination was performed of the: Full Skin Examination: scalp, head, eyes, ears, nose, lips, neck, chest, axillae, abdomen, back, buttocks, bilateral upper extremities, bilateral lower extremities, hands, feet, fingers, toes, fingernails, and toenails.   Examination notable for: SKIN EXAM, Angioma(s): Scattered red vascular papule(s)  , Lentigo/lentigines: Scattered pigmented macules that are tan to brown in color and are somewhat non-uniform in shape and concentrated in the sun-exposed areas, Nevus/nevi: Scattered well-demarcated, regular, pigmented macule(s) and/or papule(s)  , Seborrheic Keratosis(es): Stuck-on appearing keratotic papule(s) on the trunk, none  irritated with redness, crusting, edema, and/or partial avulsion, Actinic Damage/Elastosis: chronic sun damage: dyspigmentation, telangiectasia, and wrinkling  Examination limited by: Undergarments and Patient deferred removal       Assessment & Plan  (SKAP)   SKIN CANCER SCREENING PERFORMED TODAY.  BENIGN SKIN FINDINGS  -  Lentigines  - Seborrheic keratoses  - Hemangiomas   - Nevus/Multiple Benign Nevi  - Reassurance provided regarding the benign appearance of lesions noted on exam today; no treatment is indicated in the absence of symptoms/changes. - Reinforced importance of photoprotective strategies including liberal and frequent sunscreen use of a broad-spectrum SPF 30 or greater, use of protective clothing, and sun avoidance for prevention of cutaneous malignancy and photoaging.  Counseled patient on the importance of regular self-skin monitoring as well as routine clinical skin examinations as scheduled.   ACTINIC DAMAGE - Chronic condition, secondary to cumulative UV/sun exposure - Recommend daily broad spectrum sunscreen SPF 30+ to sun-exposed areas, reapply every 2 hours as needed.  - Staying in the shade or wearing long sleeves, sun glasses (UVA+UVB protection) and wide brim hats (4-inch brim around the entire circumference of the hat) are also recommended for sun protection.  - Call for new or changing lesions.    Level of service outlined above   Patient instructions (SKPI)   Procedures, orders, diagnosis for this visit:    There are no diagnoses linked to this encounter.  Return to clinic: Return in about 1 year (around 03/10/2025) for TBSE, w/ Dr. Raymund.  I, Jacquelynn V. Wilfred, CMA, am acting as scribe for Lauraine JAYSON Raymund, MD.  Documentation: I have reviewed the above documentation for accuracy and completeness, and I agree with the above.  Lauraine JAYSON Raymund, MD

## 2024-03-10 NOTE — Patient Instructions (Addendum)

## 2024-03-16 ENCOUNTER — Other Ambulatory Visit: Payer: Self-pay | Admitting: Nurse Practitioner

## 2024-03-16 DIAGNOSIS — F419 Anxiety disorder, unspecified: Secondary | ICD-10-CM

## 2024-03-16 DIAGNOSIS — F4321 Adjustment disorder with depressed mood: Secondary | ICD-10-CM

## 2024-04-06 DIAGNOSIS — N951 Menopausal and female climacteric states: Secondary | ICD-10-CM | POA: Diagnosis not present

## 2024-04-09 DIAGNOSIS — G479 Sleep disorder, unspecified: Secondary | ICD-10-CM | POA: Diagnosis not present

## 2024-04-09 DIAGNOSIS — N951 Menopausal and female climacteric states: Secondary | ICD-10-CM | POA: Diagnosis not present

## 2024-04-09 DIAGNOSIS — Z6822 Body mass index (BMI) 22.0-22.9, adult: Secondary | ICD-10-CM | POA: Diagnosis not present

## 2024-04-09 DIAGNOSIS — E7800 Pure hypercholesterolemia, unspecified: Secondary | ICD-10-CM | POA: Diagnosis not present

## 2024-04-09 DIAGNOSIS — Z79890 Hormone replacement therapy (postmenopausal): Secondary | ICD-10-CM | POA: Diagnosis not present

## 2024-05-11 ENCOUNTER — Other Ambulatory Visit: Payer: Self-pay | Admitting: Nurse Practitioner

## 2024-05-11 DIAGNOSIS — G8929 Other chronic pain: Secondary | ICD-10-CM

## 2024-05-11 DIAGNOSIS — K219 Gastro-esophageal reflux disease without esophagitis: Secondary | ICD-10-CM

## 2024-06-22 ENCOUNTER — Ambulatory Visit: Admitting: Dermatology

## 2024-07-16 ENCOUNTER — Ambulatory Visit: Admitting: Nurse Practitioner

## 2025-03-10 ENCOUNTER — Ambulatory Visit
